# Patient Record
Sex: Female | Born: 1955 | Race: White | Hispanic: No | Marital: Married | Smoking: Never smoker
Health system: Southern US, Community
[De-identification: ages and names within clinical notes are randomized; demographics above are authoritative.]

## PROBLEM LIST (undated history)

## (undated) DIAGNOSIS — Z9889 Other specified postprocedural states: Secondary | ICD-10-CM

## (undated) DIAGNOSIS — I10 Essential (primary) hypertension: Secondary | ICD-10-CM

## (undated) DIAGNOSIS — R112 Nausea with vomiting, unspecified: Secondary | ICD-10-CM

## (undated) DIAGNOSIS — M1990 Unspecified osteoarthritis, unspecified site: Secondary | ICD-10-CM

## (undated) DIAGNOSIS — R011 Cardiac murmur, unspecified: Secondary | ICD-10-CM

## (undated) DIAGNOSIS — Z87442 Personal history of urinary calculi: Secondary | ICD-10-CM

## (undated) HISTORY — PX: KNEE ARTHROSCOPY W/ MENISCAL REPAIR: SHX1877

## (undated) HISTORY — PX: CHOLECYSTECTOMY: SHX55

## (undated) HISTORY — PX: CYST EXCISION: SHX5701

---

## 2016-02-21 ENCOUNTER — Emergency Department (HOSPITAL_COMMUNITY): Payer: BLUE CROSS/BLUE SHIELD

## 2016-02-21 ENCOUNTER — Encounter (HOSPITAL_COMMUNITY): Payer: Self-pay | Admitting: Family Medicine

## 2016-02-21 ENCOUNTER — Emergency Department (HOSPITAL_COMMUNITY)
Admission: EM | Admit: 2016-02-21 | Discharge: 2016-02-21 | Disposition: A | Discharge status: Home / Self Care | Payer: BLUE CROSS/BLUE SHIELD | Attending: Emergency Medicine | Admitting: Emergency Medicine

## 2016-02-21 DIAGNOSIS — R55 Syncope and collapse: Secondary | ICD-10-CM | POA: Diagnosis not present

## 2016-02-21 DIAGNOSIS — S43102A Unspecified dislocation of left acromioclavicular joint, initial encounter: Secondary | ICD-10-CM | POA: Insufficient documentation

## 2016-02-21 DIAGNOSIS — Y929 Unspecified place or not applicable: Secondary | ICD-10-CM | POA: Diagnosis not present

## 2016-02-21 DIAGNOSIS — Y939 Activity, unspecified: Secondary | ICD-10-CM | POA: Diagnosis not present

## 2016-02-21 DIAGNOSIS — I10 Essential (primary) hypertension: Secondary | ICD-10-CM | POA: Insufficient documentation

## 2016-02-21 DIAGNOSIS — W108XXA Fall (on) (from) other stairs and steps, initial encounter: Secondary | ICD-10-CM | POA: Diagnosis not present

## 2016-02-21 DIAGNOSIS — Y999 Unspecified external cause status: Secondary | ICD-10-CM | POA: Insufficient documentation

## 2016-02-21 DIAGNOSIS — S4992XA Unspecified injury of left shoulder and upper arm, initial encounter: Secondary | ICD-10-CM | POA: Diagnosis present

## 2016-02-21 DIAGNOSIS — Z79899 Other long term (current) drug therapy: Secondary | ICD-10-CM | POA: Diagnosis not present

## 2016-02-21 HISTORY — DX: Essential (primary) hypertension: I10

## 2016-02-21 MED ORDER — TETANUS-DIPHTH-ACELL PERTUSSIS 5-2.5-18.5 LF-MCG/0.5 IM SUSP: 0.5000 mL | Freq: Once | INTRAMUSCULAR | Status: DC

## 2016-02-21 MED ORDER — OXYCODONE-ACETAMINOPHEN 5-325 MG PO TABS
1.0000 | ORAL_TABLET | ORAL | Status: DC | PRN
  Administered 2016-02-21: 1 via ORAL

## 2016-02-21 MED ORDER — OXYCODONE-ACETAMINOPHEN 5-325 MG PO TABS: 2.0000 | ORAL_TABLET | Freq: Four times a day (QID) | ORAL | 0 refills | Status: DC | PRN

## 2016-02-21 MED ORDER — OXYCODONE-ACETAMINOPHEN 5-325 MG PO TABS
ORAL_TABLET | ORAL | Status: AC
  Filled 2016-02-21: qty 1

## 2016-02-21 MED ORDER — OXYCODONE-ACETAMINOPHEN 5-325 MG PO TABS
1.0000 | ORAL_TABLET | Freq: Once | ORAL | Status: AC
  Administered 2016-02-21: 1 via ORAL
  Filled 2016-02-21: qty 1

## 2016-02-21 NOTE — ED Provider Notes (Signed)
[facility]-EMERGENCY DEPT Provider Note   CSN: [id number] Arrival date & time: 02/21/16  1318     History   Chief Complaint Chief Complaint  Patient presents with  . Fall    HPI Roger ShelterRhonda Venditto is a 60 y.o. female.  The history is provided by the patient and medical records. No language interpreter was used.  Fall  Pertinent negatives include no chest pain, no abdominal pain, no headaches and no shortness of breath.   Roger ShelterRhonda Weltman is a 60 y.o. female  with a PMH of HTN who presents to the Emergency Department for evaluation after mechanical fall at 9am this morning. Patient had her dog on a leash about to go for a walk when the dog saw a bird and pulled her down approx. 7-8 stairs. She fell forward, hitting her left shoulder, left knee and right aspect of face. She is unsure about LOC. Family at bedside state that she seemed confused for about 20-30 minutes after incident. + nausea but no emesis. Back to baseline mental status currently. Denies headache or neck pain. Endorses left shoulder pain and left knee pain. Tetanus up-to-date. Not on blood thinners.    Past Medical History:  Diagnosis Date  . Hypertension     There are no active problems to display for this patient.   History reviewed. No pertinent surgical history.  OB History    No data available       Home Medications    Prior to Admission medications   Medication Sig Start Date End Date Taking? Authorizing Provider  CALCIUM-VITAMIN D PO Take 1 tablet by mouth daily.   Yes Historical Provider, MD  Flaxseed, Linseed, (FLAX SEED OIL) 1000 MG CAPS Take 1,000 mg by mouth 2 (two) times daily.   Yes Historical Provider, MD  lisinopril (PRINIVIL,ZESTRIL) 30 MG tablet Take 30 mg by mouth at bedtime. 01/12/16  Yes Historical Provider, MD  MAGNESIUM PO Take 1 tablet by mouth daily.   Yes Historical Provider, MD  meloxicam (MOBIC) 15 MG tablet Take 15 mg by mouth daily. 01/31/16  Yes Historical Provider, MD  Omega-3  Fatty Acids (FISH OIL) 1000 MG CAPS Take 1,000 mg by mouth 2 (two) times daily.   Yes Historical Provider, MD  oxyCODONE-acetaminophen (PERCOCET/ROXICET) 5-325 MG tablet Take 2 tablets by mouth every 6 (six) hours as needed for severe pain. 02/21/16   Chase PicketJaime Pilcher Caydn Justen, PA-C    Family History History reviewed. No pertinent family history.  Social History Social History  Substance Use Topics  . Smoking status: Never Smoker  . Smokeless tobacco: Never Used  . Alcohol use Not on file     Allergies   Penicillins and Codeine   Review of Systems Review of Systems  Constitutional: Negative for fatigue and fever.  HENT: Positive for facial swelling.   Eyes: Negative for visual disturbance.  Respiratory: Negative for cough and shortness of breath.   Cardiovascular: Negative for chest pain.  Gastrointestinal: Negative for abdominal pain, nausea and vomiting.  Musculoskeletal: Positive for arthralgias (L shoulder, L knee). Negative for back pain and neck pain.  Skin: Positive for wound.  Neurological: Positive for syncope (Possible). Negative for dizziness and headaches.  Hematological: Does not bruise/bleed easily.     Physical Exam Updated Vital Signs BP 178/77   Pulse 101   Temp 98.2 F (36.8 C) (Oral)   Resp 18   SpO2 98%   Physical Exam  Constitutional: She is oriented to person, place, and time. She appears well-developed and  well-nourished. No distress.  HENT:  Head: Normocephalic. Head is without Battle's sign.    Right Ear: No hemotympanum.  Left Ear: No hemotympanum.  Nose: No rhinorrhea or nasal septal hematoma.  Bruising under left eyelid.  Neck: Neck supple. No tracheal deviation present.  No midline or paraspinal tenderness. Full range of motion without pain.  Cardiovascular: Normal rate, regular rhythm and normal heart sounds.   No murmur heard. Pulmonary/Chest: Effort normal and breath sounds normal. No respiratory distress. She has no wheezes. She has  no rales.  Abdominal: Soft. She exhibits no distension. There is no tenderness.  Musculoskeletal:  [facility], decreased ROM. 2+ Radial pulse and sensation intact. 5/5 grip strength. Able to move elbow and wrist with no difficulty and no tenderness to palpation.  Left knee with tenderness over skin abrasion. Ligaments intact. No joint line or patellar tenderness. Full ROM.   Neurological: She is alert and oriented to person, place, and time.  All four extremities NVI.  Alert, oriented, thought content appropriate, able to give a coherent history. Speech is clear and goal oriented, able to follow commands.  Cranial Nerves:  II:  Peripheral visual fields grossly normal, pupils equal, round, reactive to light III, IV, VI: EOM intact bilaterally, ptosis not present V,VII: smile symmetric, eyes kept closed tightly against resistance, facial light touch sensation equal VIII: hearing grossly normal IX, X: symmetric soft palate movement, uvula elevates symmetrically  XI: bilateral shoulder shrug symmetric and strong XII: midline tongue extension Normal finger-to-nose and rapid alternating movements.  Skin: Skin is warm and dry.  Superficial abrasion to left knee.   Nursing note and vitals reviewed.    ED Treatments / Results  Labs (all labs ordered are listed, but only abnormal results are displayed) Labs Reviewed - No data to display  EKG  EKG Interpretation None       Radiology Ct Head Wo Contrast  Result Date: 02/21/2016 CLINICAL DATA:  Larey SeatFell down stairs today. Head in right facial injury with bruising and laceration. Initial encounter. EXAM: CT HEAD WITHOUT CONTRAST CT MAXILLOFACIAL WITHOUT CONTRAST TECHNIQUE: Multidetector CT imaging of the head and maxillofacial structures were performed using the standard protocol without intravenous contrast. Multiplanar CT image reconstructions of the maxillofacial structures were also generated. COMPARISON:  None. FINDINGS:  CT HEAD FINDINGS Brain: No evidence of acute infarction, hemorrhage, hydrocephalus, extra-axial collection or mass lesion/mass effect. Vascular: No hyperdense vessel or unexpected calcification. Skull: Normal. Negative for fracture or focal lesion. Other: None. CT MAXILLOFACIAL FINDINGS Osseous: No fracture or mandibular dislocation. No destructive process. Orbits: Negative. No traumatic or inflammatory finding. Sinuses: Clear. Soft tissues: Mild right maxillary soft tissue swelling. IMPRESSION: Negative noncontrast head CT. Mild right maxillary soft tissue swelling. No evidence of orbital or facial bone fracture. Electronically Signed   By: Myles RosenthalJohn  Stahl M.D.   On: 02/21/2016 17:57   Dg Shoulder Left  Result Date: 02/21/2016 CLINICAL DATA:  Fall downstairs, left shoulder pain EXAM: LEFT SHOULDER - 2+ VIEW COMPARISON:  None. FINDINGS: Left distal clavicle is superiorly displaced in relation to the distal clavicle without fracture compatible with a type 3 AC joint separation. Glenohumeral joint is grossly aligned. No other acute osseous finding. IMPRESSION: Grade 3 left AC joint separation. No acute displaced fracture. Electronically Signed   By: Judie PetitM.  Shick M.D.   On: 02/21/2016 15:18   Dg Knee Complete 4 Views Left  Result Date: 02/21/2016 CLINICAL DATA:  Left knee pain after falling down a flight of  stairs. EXAM: LEFT KNEE - COMPLETE 4+ VIEW COMPARISON:  None. FINDINGS: Marked medial joint space narrowing with associated spur formation. There is also lateral and patellofemoral spur formation. No fracture, dislocation or effusion. IMPRESSION: No fracture. Tricompartmental degenerative changes, most pronounced medially. Electronically Signed   By: Beckie Salts M.D.   On: 02/21/2016 15:15   Ct Maxillofacial Wo Contrast  Result Date: 02/21/2016 CLINICAL DATA:  Larey Seat down stairs today. Head in right facial injury with bruising and laceration. Initial encounter. EXAM: CT HEAD WITHOUT CONTRAST CT MAXILLOFACIAL  WITHOUT CONTRAST TECHNIQUE: Multidetector CT imaging of the head and maxillofacial structures were performed using the standard protocol without intravenous contrast. Multiplanar CT image reconstructions of the maxillofacial structures were also generated. COMPARISON:  None. FINDINGS: CT HEAD FINDINGS Brain: No evidence of acute infarction, hemorrhage, hydrocephalus, extra-axial collection or mass lesion/mass effect. Vascular: No hyperdense vessel or unexpected calcification. Skull: Normal. Negative for fracture or focal lesion. Other: None. CT MAXILLOFACIAL FINDINGS Osseous: No fracture or mandibular dislocation. No destructive process. Orbits: Negative. No traumatic or inflammatory finding. Sinuses: Clear. Soft tissues: Mild right maxillary soft tissue swelling. IMPRESSION: Negative noncontrast head CT. Mild right maxillary soft tissue swelling. No evidence of orbital or facial bone fracture. Electronically Signed   By: Myles Rosenthal M.D.   On: 02/21/2016 17:57    Procedures Procedures (including critical care time)  Medications Ordered in ED Medications  oxyCODONE-acetaminophen (PERCOCET/ROXICET) 5-325 MG per tablet 1 tablet (1 tablet Oral Given 02/21/16 1419)  oxyCODONE-acetaminophen (PERCOCET/ROXICET) 5-325 MG per tablet (not administered)  oxyCODONE-acetaminophen (PERCOCET/ROXICET) 5-325 MG per tablet 1 tablet (not administered)     Initial Impression / Assessment and Plan / ED Course  I have reviewed the triage vital signs and the nursing notes.  Pertinent labs & imaging results that were available during my care of the patient were reviewed by me and considered in my medical decision making (see chart for details).  Clinical Course    Teola Felipe is a 60 y.o. female who presents to ED for evaluation following mechanical fall. She did hit her head with ? LOC. No focal neuro deficits on exam. No midline cervical tenderness. C-spine cleared with Nexus criteria. Bruising and swelling  underneath right eye. CT head and maxillofacial reassuring. Patient complaining of left shoulder pain - x-ray shows grade III left AC joint separation. LUE is NVI on exam. Patient placed in sling and agrees to follow up with orthopedics. Rx for short course pain medication given. Symptomatic home care instructions discussed. All questions answered.   Patient seen by and discussed with Dr. Ethelda Chick who agrees with treatment plan.   Final Clinical Impressions(s) / ED Diagnoses   Final diagnoses:  AC separation, left, initial encounter    New Prescriptions New Prescriptions   OXYCODONE-ACETAMINOPHEN (PERCOCET/ROXICET) 5-325 MG TABLET    Take 2 tablets by mouth every 6 (six) hours as needed for severe pain.       [facility] Josilynn Losh, PA-C 02/21/16 1850    Doug Sou, MD 02/21/16 2253

## 2016-02-21 NOTE — ED Triage Notes (Signed)
Pt here for fall down some steps today. Pt was pulled down by a dog. Pt has injury to left shoulder and left knee. sts that she doesn't remember going into the house after the fall but feels okay now. Bruising to right eye. No knots on head. Denies dizziness or neck pain.

## 2016-02-21 NOTE — ED Provider Notes (Signed)
Patient fell down proximal me 7 steps 9 AM today when she was pulled down the steps by her [place] shepherd. She injured her left shoulder and left knee and face as result of event. No other Questionable loss of consciousness. She denies any neck pain. On exam alert Glasgow Coma Score 15 HEENT exam swollen and tender at left infraorbital area. No pain on extraocular movement. No diplopia on extraocular movement. Tiny abrasion about nose. Neck without tenderness. Full range of motion left upper extremity tender at the before meals joint. Radial pulse 2+. Left lower extremity there is an abrasion about the anterior knee without deformity or swelling or bony tenderness. All 4 extremities neurovascularly intact Cervical spine cleared by Nexus criteria   Doug SouSam Vianna Venezia, MD 02/21/16 1649

## 2016-02-21 NOTE — Progress Notes (Signed)
Orthopedic Tech Progress Note Patient Details:  Susan ShelterRhonda Carter 1955-03-26 [id number]  Ortho Devices Type of Ortho Device: Arm sling Ortho Device/Splint Location: Applied Arm Sling with Instructions for care to Left Arm. Family was at bedside. Ortho Device/Splint Interventions: Application, Adjustment   Alvina ChouWilliams, Anjalee Cope C 02/21/2016, 6:51 PM

## 2016-02-21 NOTE — ED Notes (Signed)
Ortho in room

## 2016-02-21 NOTE — Discharge Instructions (Signed)
Wear sling throughout the day. Apply ice to aid in pain and swelling of knee, shoulder and face. Pain medication as needed for severe pain. Please call your orthopedist on Monday morning to schedule a follow up appointment.  Return to ER for new or worsening symptoms, any additional concerns.

## 2019-03-21 ENCOUNTER — Emergency Department (HOSPITAL_COMMUNITY)
Admission: EM | Admit: 2019-03-21 | Discharge: 2019-03-22 | Disposition: A | Discharge status: Home / Self Care | Payer: BC Managed Care – PPO | Source: Home / Self Care | Attending: Emergency Medicine | Admitting: Emergency Medicine

## 2019-03-21 ENCOUNTER — Emergency Department (HOSPITAL_COMMUNITY): Payer: BC Managed Care – PPO

## 2019-03-21 ENCOUNTER — Encounter (HOSPITAL_COMMUNITY): Payer: Self-pay | Admitting: Emergency Medicine

## 2019-03-21 DIAGNOSIS — N12 Tubulo-interstitial nephritis, not specified as acute or chronic: Secondary | ICD-10-CM

## 2019-03-21 DIAGNOSIS — Z886 Allergy status to analgesic agent status: Secondary | ICD-10-CM | POA: Diagnosis not present

## 2019-03-21 DIAGNOSIS — N136 Pyonephrosis: Secondary | ICD-10-CM | POA: Diagnosis not present

## 2019-03-21 DIAGNOSIS — Z885 Allergy status to narcotic agent status: Secondary | ICD-10-CM | POA: Diagnosis not present

## 2019-03-21 DIAGNOSIS — N390 Urinary tract infection, site not specified: Secondary | ICD-10-CM | POA: Diagnosis present

## 2019-03-21 DIAGNOSIS — N200 Calculus of kidney: Secondary | ICD-10-CM

## 2019-03-21 DIAGNOSIS — Z6841 Body Mass Index (BMI) 40.0 and over, adult: Secondary | ICD-10-CM | POA: Diagnosis not present

## 2019-03-21 DIAGNOSIS — Z880 Allergy status to penicillin: Secondary | ICD-10-CM | POA: Diagnosis not present

## 2019-03-21 DIAGNOSIS — Z20828 Contact with and (suspected) exposure to other viral communicable diseases: Secondary | ICD-10-CM | POA: Diagnosis not present

## 2019-03-21 DIAGNOSIS — Z791 Long term (current) use of non-steroidal anti-inflammatories (NSAID): Secondary | ICD-10-CM | POA: Diagnosis not present

## 2019-03-21 DIAGNOSIS — Z79899 Other long term (current) drug therapy: Secondary | ICD-10-CM | POA: Diagnosis not present

## 2019-03-21 DIAGNOSIS — I10 Essential (primary) hypertension: Secondary | ICD-10-CM | POA: Diagnosis not present

## 2019-03-21 LAB — COMPREHENSIVE METABOLIC PANEL
ALT: 29 U/L (ref 0–44)
AST: 27 U/L (ref 15–41)
Albumin: 3.6 g/dL (ref 3.5–5.0)
Alkaline Phosphatase: 59 U/L (ref 38–126)
Anion gap: 10 (ref 5–15)
BUN: 16 mg/dL (ref 8–23)
CO2: 26 mmol/L (ref 22–32)
Calcium: 9.3 mg/dL (ref 8.9–10.3)
Chloride: 105 mmol/L (ref 98–111)
Creatinine, Ser: 0.66 mg/dL (ref 0.44–1.00)
GFR calc Af Amer: >60 mL/min (ref >60)
GFR calc non Af Amer: >60 mL/min (ref >60)
Glucose, Bld: 125 mg/dL — ABNORMAL HIGH (ref 70–99)
Potassium: 4.1 mmol/L (ref 3.5–5.1)
Sodium: 141 mmol/L (ref 135–145)
Total Bilirubin: 1.3 mg/dL — ABNORMAL HIGH (ref 0.3–1.2)
Total Protein: 7.5 g/dL (ref 6.5–8.1)

## 2019-03-21 LAB — URINALYSIS, ROUTINE W REFLEX MICROSCOPIC
Bilirubin Urine: NEGATIVE
Glucose, UA: NEGATIVE mg/dL
Ketones, ur: 80 mg/dL — AB
Nitrite: NEGATIVE
Protein, ur: 30 mg/dL — AB
RBC / HPF: >50 RBC/hpf — ABNORMAL HIGH (ref 0–5)
Specific Gravity, Urine: 1.03 (ref 1.005–1.030)
pH: 5 (ref 5.0–8.0)

## 2019-03-21 LAB — LACTIC ACID, PLASMA: Lactic Acid, Venous: 1.5 mmol/L (ref 0.5–1.9)

## 2019-03-21 MED ORDER — CEPHALEXIN 500 MG PO CAPS
1000.0000 mg | ORAL_CAPSULE | Freq: Once | ORAL | Status: AC
  Administered 2019-03-21: 1000 mg via ORAL
  Filled 2019-03-21: qty 2

## 2019-03-21 NOTE — ED Triage Notes (Signed)
Patient reports dx with UTI yesterday. States taking abx without relief. C/o right flank pain radiating to lower abdomen. Reports two episodes of vomiting today.

## 2019-03-22 ENCOUNTER — Encounter (HOSPITAL_COMMUNITY)
Admission: AD | Disposition: A | Discharge status: Home / Self Care | Payer: Self-pay | Source: Ambulatory Visit | Attending: Urology

## 2019-03-22 ENCOUNTER — Other Ambulatory Visit: Payer: Self-pay | Admitting: Urology

## 2019-03-22 ENCOUNTER — Telehealth: Payer: Self-pay | Admitting: Urology

## 2019-03-22 ENCOUNTER — Encounter (HOSPITAL_COMMUNITY): Payer: Self-pay | Admitting: Urology

## 2019-03-22 ENCOUNTER — Inpatient Hospital Stay (HOSPITAL_COMMUNITY): Payer: BC Managed Care – PPO | Admitting: Certified Registered Nurse Anesthetist

## 2019-03-22 ENCOUNTER — Other Ambulatory Visit: Payer: Self-pay

## 2019-03-22 ENCOUNTER — Inpatient Hospital Stay (HOSPITAL_COMMUNITY): Payer: BC Managed Care – PPO

## 2019-03-22 ENCOUNTER — Ambulatory Visit (HOSPITAL_COMMUNITY)
Admission: AD | Admit: 2019-03-22 | Discharge: 2019-03-22 | Disposition: A | Discharge status: Home / Self Care | Payer: BC Managed Care – PPO | Source: Ambulatory Visit | Attending: Urology | Admitting: Urology

## 2019-03-22 DIAGNOSIS — I10 Essential (primary) hypertension: Secondary | ICD-10-CM | POA: Insufficient documentation

## 2019-03-22 DIAGNOSIS — Z20828 Contact with and (suspected) exposure to other viral communicable diseases: Secondary | ICD-10-CM | POA: Insufficient documentation

## 2019-03-22 DIAGNOSIS — N136 Pyonephrosis: Secondary | ICD-10-CM | POA: Insufficient documentation

## 2019-03-22 DIAGNOSIS — Z791 Long term (current) use of non-steroidal anti-inflammatories (NSAID): Secondary | ICD-10-CM | POA: Insufficient documentation

## 2019-03-22 DIAGNOSIS — Z79899 Other long term (current) drug therapy: Secondary | ICD-10-CM | POA: Insufficient documentation

## 2019-03-22 DIAGNOSIS — Z886 Allergy status to analgesic agent status: Secondary | ICD-10-CM | POA: Insufficient documentation

## 2019-03-22 DIAGNOSIS — Z880 Allergy status to penicillin: Secondary | ICD-10-CM | POA: Insufficient documentation

## 2019-03-22 DIAGNOSIS — Z6841 Body Mass Index (BMI) 40.0 and over, adult: Secondary | ICD-10-CM | POA: Insufficient documentation

## 2019-03-22 DIAGNOSIS — Z885 Allergy status to narcotic agent status: Secondary | ICD-10-CM | POA: Insufficient documentation

## 2019-03-22 HISTORY — DX: Personal history of urinary calculi: Z87.442

## 2019-03-22 HISTORY — DX: Other specified postprocedural states: Z98.890

## 2019-03-22 HISTORY — DX: Nausea with vomiting, unspecified: R11.2

## 2019-03-22 HISTORY — DX: Unspecified osteoarthritis, unspecified site: M19.90

## 2019-03-22 HISTORY — PX: CYSTOSCOPY W/ URETERAL STENT PLACEMENT: SHX1429

## 2019-03-22 HISTORY — DX: Cardiac murmur, unspecified: R01.1

## 2019-03-22 LAB — URINALYSIS, ROUTINE W REFLEX MICROSCOPIC
Bilirubin Urine: NEGATIVE
Glucose, UA: NEGATIVE mg/dL
Ketones, ur: 20 mg/dL — AB
Nitrite: NEGATIVE
Protein, ur: 100 mg/dL — AB
RBC / HPF: >50 RBC/hpf — ABNORMAL HIGH (ref 0–5)
Specific Gravity, Urine: 1.028 (ref 1.005–1.030)
WBC, UA: >50 WBC/hpf — ABNORMAL HIGH (ref 0–5)
pH: 5 (ref 5.0–8.0)

## 2019-03-22 LAB — CBC WITH DIFFERENTIAL/PLATELET
Abs Immature Granulocytes: 0.04 10*3/uL (ref 0.00–0.07)
Basophils Absolute: 0 10*3/uL (ref 0.0–0.1)
Basophils Relative: 0 %
Eosinophils Absolute: 0 10*3/uL (ref 0.0–0.5)
Eosinophils Relative: 0 %
HCT: 43.8 % (ref 36.0–46.0)
Hemoglobin: 13.9 g/dL (ref 12.0–15.0)
Immature Granulocytes: 0 %
Lymphocytes Relative: 13 %
Lymphs Abs: 1.4 10*3/uL (ref 0.7–4.0)
MCH: 28.9 pg (ref 26.0–34.0)
MCHC: 31.7 g/dL (ref 30.0–36.0)
MCV: 91.1 fL (ref 80.0–100.0)
Monocytes Absolute: 1.6 10*3/uL — ABNORMAL HIGH (ref 0.1–1.0)
Monocytes Relative: 15 %
Neutro Abs: 7.7 10*3/uL (ref 1.7–7.7)
Neutrophils Relative %: 72 %
Platelets: 223 10*3/uL (ref 150–400)
RBC: 4.81 MIL/uL (ref 3.87–5.11)
RDW: 13.1 % (ref 11.5–15.5)
WBC: 10.8 10*3/uL — ABNORMAL HIGH (ref 4.0–10.5)
nRBC: 0 % (ref 0.0–0.2)

## 2019-03-22 LAB — RESPIRATORY PANEL BY RT PCR (FLU A&B, COVID)
Influenza A by PCR: NEGATIVE
Influenza B by PCR: NEGATIVE
SARS Coronavirus 2 by RT PCR: NEGATIVE

## 2019-03-22 SURGERY — CYSTOSCOPY, WITH RETROGRADE PYELOGRAM AND URETERAL STENT INSERTION
Anesthesia: General | Laterality: Right

## 2019-03-22 MED ORDER — SCOPOLAMINE 1 MG/3DAYS TD PT72: 1.0000 | MEDICATED_PATCH | TRANSDERMAL | Status: DC

## 2019-03-22 MED ORDER — ONDANSETRON HCL 4 MG PO TABS: 4.0000 mg | ORAL_TABLET | Freq: Three times a day (TID) | ORAL | 0 refills | Status: AC | PRN

## 2019-03-22 MED ORDER — PHENYLEPHRINE 40 MCG/ML (10ML) SYRINGE FOR IV PUSH (FOR BLOOD PRESSURE SUPPORT)
PREFILLED_SYRINGE | INTRAVENOUS | Status: AC
  Filled 2019-03-22: qty 10

## 2019-03-22 MED ORDER — HYDROCODONE-ACETAMINOPHEN 5-325 MG PO TABS: 1.0000 | ORAL_TABLET | ORAL | 0 refills | Status: AC | PRN

## 2019-03-22 MED ORDER — DEXAMETHASONE SODIUM PHOSPHATE 10 MG/ML IJ SOLN
INTRAMUSCULAR | Status: DC | PRN
  Administered 2019-03-22: 10 mg via INTRAVENOUS

## 2019-03-22 MED ORDER — KETOROLAC TROMETHAMINE 30 MG/ML IJ SOLN
INTRAMUSCULAR | Status: AC
  Filled 2019-03-22: qty 1

## 2019-03-22 MED ORDER — DEXAMETHASONE SODIUM PHOSPHATE 10 MG/ML IJ SOLN
INTRAMUSCULAR | Status: AC
  Filled 2019-03-22: qty 1

## 2019-03-22 MED ORDER — IOHEXOL 300 MG/ML  SOLN
INTRAMUSCULAR | Status: DC | PRN
  Administered 2019-03-22: 7 mL

## 2019-03-22 MED ORDER — LACTATED RINGERS IV SOLN: INTRAVENOUS | Status: DC

## 2019-03-22 MED ORDER — FENTANYL CITRATE (PF) 100 MCG/2ML IJ SOLN: 25.0000 ug | INTRAMUSCULAR | Status: DC | PRN

## 2019-03-22 MED ORDER — FENTANYL CITRATE (PF) 100 MCG/2ML IJ SOLN
INTRAMUSCULAR | Status: AC
  Filled 2019-03-22: qty 2

## 2019-03-22 MED ORDER — TAMSULOSIN HCL 0.4 MG PO CAPS: 0.4000 mg | ORAL_CAPSULE | Freq: Every day | ORAL | 0 refills | Status: AC

## 2019-03-22 MED ORDER — CIPROFLOXACIN IN D5W 400 MG/200ML IV SOLN
INTRAVENOUS | Status: AC
  Filled 2019-03-22: qty 200

## 2019-03-22 MED ORDER — FENTANYL CITRATE (PF) 100 MCG/2ML IJ SOLN
INTRAMUSCULAR | Status: DC | PRN
  Administered 2019-03-22 (×2): 50 ug via INTRAVENOUS

## 2019-03-22 MED ORDER — MIDAZOLAM HCL 5 MG/5ML IJ SOLN
INTRAMUSCULAR | Status: DC | PRN
  Administered 2019-03-22: 2 mg via INTRAVENOUS

## 2019-03-22 MED ORDER — CIPROFLOXACIN IN D5W 400 MG/200ML IV SOLN
400.0000 mg | INTRAVENOUS | Status: AC
  Administered 2019-03-22: 400 mg via INTRAVENOUS

## 2019-03-22 MED ORDER — PROMETHAZINE HCL 25 MG/ML IJ SOLN: 6.2500 mg | INTRAMUSCULAR | Status: DC | PRN

## 2019-03-22 MED ORDER — SCOPOLAMINE 1 MG/3DAYS TD PT72
MEDICATED_PATCH | TRANSDERMAL | Status: AC
  Administered 2019-03-22: 17:00:00 1.5 mg via TRANSDERMAL
  Filled 2019-03-22: qty 1

## 2019-03-22 MED ORDER — PHENYLEPHRINE 40 MCG/ML (10ML) SYRINGE FOR IV PUSH (FOR BLOOD PRESSURE SUPPORT)
PREFILLED_SYRINGE | INTRAVENOUS | Status: DC | PRN
  Administered 2019-03-22 (×4): 120 ug via INTRAVENOUS

## 2019-03-22 MED ORDER — CEPHALEXIN 500 MG PO CAPS: 500.0000 mg | ORAL_CAPSULE | Freq: Four times a day (QID) | ORAL | 0 refills | Status: DC

## 2019-03-22 MED ORDER — ONDANSETRON HCL 4 MG/2ML IJ SOLN
INTRAMUSCULAR | Status: DC | PRN
  Administered 2019-03-22: 4 mg via INTRAVENOUS

## 2019-03-22 MED ORDER — IBUPROFEN 200 MG PO TABS
400.0000 mg | ORAL_TABLET | Freq: Once | ORAL | Status: AC
  Administered 2019-03-22: 400 mg via ORAL
  Filled 2019-03-22: qty 2

## 2019-03-22 MED ORDER — ONDANSETRON HCL 4 MG/2ML IJ SOLN
INTRAMUSCULAR | Status: AC
  Filled 2019-03-22: qty 2

## 2019-03-22 MED ORDER — OXYBUTYNIN CHLORIDE 5 MG PO TABS: 5.0000 mg | ORAL_TABLET | Freq: Three times a day (TID) | ORAL | 0 refills | Status: AC | PRN

## 2019-03-22 MED ORDER — ACETAMINOPHEN 500 MG PO TABS
1000.0000 mg | ORAL_TABLET | Freq: Once | ORAL | Status: AC
  Administered 2019-03-22: 1000 mg via ORAL
  Filled 2019-03-22: qty 2

## 2019-03-22 MED ORDER — LIDOCAINE 2% (20 MG/ML) 5 ML SYRINGE
INTRAMUSCULAR | Status: DC | PRN
  Administered 2019-03-22: 60 mg via INTRAVENOUS

## 2019-03-22 MED ORDER — MIDAZOLAM HCL 2 MG/2ML IJ SOLN
INTRAMUSCULAR | Status: AC
  Filled 2019-03-22: qty 2

## 2019-03-22 MED ORDER — PROPOFOL 10 MG/ML IV BOLUS
INTRAVENOUS | Status: DC | PRN
  Administered 2019-03-22: 200 mg via INTRAVENOUS

## 2019-03-22 MED ORDER — PHENAZOPYRIDINE HCL 100 MG PO TABS: 100.0000 mg | ORAL_TABLET | Freq: Three times a day (TID) | ORAL | 0 refills | Status: AC | PRN

## 2019-03-22 MED ORDER — KETOROLAC TROMETHAMINE 15 MG/ML IJ SOLN
INTRAMUSCULAR | Status: DC | PRN
  Administered 2019-03-22: 15 mg via INTRAVENOUS

## 2019-03-22 MED ORDER — SODIUM CHLORIDE 0.9 % IR SOLN
Status: DC | PRN
  Administered 2019-03-22: 3000 mL

## 2019-03-22 MED ORDER — 0.9 % SODIUM CHLORIDE (POUR BTL) OPTIME
TOPICAL | Status: DC | PRN
  Administered 2019-03-22: 1000 mL

## 2019-03-22 SURGICAL SUPPLY — 13 items
BAG URO CATCHER STRL LF (MISCELLANEOUS) ×3 IMPLANT
CATH INTERMIT  6FR 70CM (CATHETERS) ×3 IMPLANT
CLOTH BEACON ORANGE TIMEOUT ST (SAFETY) ×3 IMPLANT
GLOVE BIO SURGEON STRL SZ 6 (GLOVE) ×3 IMPLANT
GOWN SPEC L3 MED W/TWL (GOWN DISPOSABLE) ×3 IMPLANT
GOWN STRL REUS W/TWL LRG LVL3 (GOWN DISPOSABLE) ×6 IMPLANT
GUIDEWIRE STR DUAL SENSOR (WIRE) ×3 IMPLANT
KIT TURNOVER KIT A (KITS) IMPLANT
MANIFOLD NEPTUNE II (INSTRUMENTS) ×3 IMPLANT
PACK CYSTO (CUSTOM PROCEDURE TRAY) ×3 IMPLANT
TUBING CONNECTING 10 (TUBING) ×2 IMPLANT
TUBING CONNECTING 10' (TUBING) ×1
TUBING UROLOGY SET (TUBING) IMPLANT

## 2019-03-22 NOTE — ED Provider Notes (Signed)
Emergency Department Provider Note   I have reviewed the triage vital signs and the nursing notes.   HISTORY  Chief Complaint Urinary Tract Infection   HPI Susan Carter is a 64 y.o. female who presents for urinary tract infection.  Patient states that she was diagnosed with a urinary tract infection yesterday urgent care was started on nitrofurantoin.  She had a little bit of mild backache and a fever of 103.  She states that she had a couple doses of her medication does not seem to be getting better as it usually does.  She actually states her back pain seems to be getting worse.  Her fever has improved.  She had some nausea but no vomiting.  No other sick contacts.  She is tested negative for Covid yesterday.   No other associated or modifying symptoms.    Past Medical History:  Diagnosis Date  . Hypertension     There are no problems to display for this patient.   History reviewed. No pertinent surgical history.  Current Outpatient Rx  . Order #: [id number] Class: Historical Med  . Order #: [id number] Class: Historical Med  . Order #: [id number] Class: Historical Med  . Order #: [id number] Class: Historical Med  . Order #: [id number] Class: Historical Med  . Order #: [id number] Class: Historical Med  . Order #: [id number] Class: Historical Med  . Order #: [id number] Class: Normal  . Order #: [id number] Class: Normal    Allergies Penicillins, Codeine, and Nabumetone  No family history on file.  Social History Social History   Tobacco Use  . Smoking status: Never Smoker  . Smokeless tobacco: Never Used  Substance Use Topics  . Alcohol use: Not on file  . Drug use: Not on file    Review of Systems  All other systems negative except as documented in the HPI. All pertinent positives and negatives as reviewed in the HPI. ____________________________________________   PHYSICAL EXAM:  VITAL SIGNS: ED Triage Vitals [03/21/19 1719]  Enc Vitals Group     BP (!) 193/96       Pulse Rate (!) 115     Resp 18     Temp 99.3 F (37.4 C)     Temp Source Oral     SpO2 100 %     Weight 225 lb (102.1 kg)     Height 5\' 1"  (1.549 m)    Constitutional: Alert and oriented. Well appearing and in no acute distress. Eyes: Conjunctivae are normal. PERRL. EOMI. Head: Atraumatic. Nose: No congestion/rhinnorhea. Mouth/Throat: Mucous membranes are moist.  Oropharynx non-erythematous. Neck: No stridor.  No meningeal signs.   Cardiovascular: Normal rate, regular rhythm. Good peripheral circulation. Grossly normal heart sounds.   Respiratory: Normal respiratory effort.  No retractions. Lungs CTAB. Gastrointestinal: Soft and nontender. No distention.  Musculoskeletal: No lower extremity tenderness nor edema. No gross deformities of extremities.  Mild right CVA tenderness to palpation. Neurologic:  Normal speech and language. No gross focal neurologic deficits are appreciated.  Skin:  Skin is warm, dry and intact. No rash noted.  ____________________________________________   LABS (all labs ordered are listed, but only abnormal results are displayed)  Labs Reviewed  COMPREHENSIVE METABOLIC PANEL - Abnormal; Notable for the following components:      Result Value   Glucose, Bld 125 (*)    Total Bilirubin 1.3 (*)    All other components within normal limits  URINALYSIS, ROUTINE W REFLEX MICROSCOPIC - Abnormal; Notable for the following components:   Color, Urine AMBER (*)  APPearance HAZY (*)    Hgb urine dipstick MODERATE (*)    Ketones, ur 80 (*)    Protein, ur 30 (*)    Leukocytes,Ua MODERATE (*)    RBC / HPF >50 (*)    Bacteria, UA MANY (*)    All other components within normal limits  CBC WITH DIFFERENTIAL/PLATELET - Abnormal; Notable for the following components:   WBC 10.8 (*)    Monocytes Absolute 1.6 (*)    All other components within normal limits  URINALYSIS, ROUTINE W REFLEX MICROSCOPIC - Abnormal; Notable for the following components:   Color,  Urine AMBER (*)    APPearance HAZY (*)    Hgb urine dipstick LARGE (*)    Ketones, ur 20 (*)    Protein, ur 100 (*)    Leukocytes,Ua LARGE (*)    RBC / HPF >50 (*)    WBC, UA >50 (*)    Bacteria, UA MANY (*)    All other components within normal limits  URINE CULTURE  LACTIC ACID, PLASMA  LACTIC ACID, PLASMA  CBC WITH DIFFERENTIAL/PLATELET  CBC WITH DIFFERENTIAL/PLATELET   ____________________________________________   RADIOLOGY  CT Renal Stone Study  Result Date: 03/22/2019 CLINICAL DATA:  Right flank pain. Diagnosed with urinary tract infection yesterday. No relief with antibiotics. EXAM: CT ABDOMEN AND PELVIS WITHOUT CONTRAST TECHNIQUE: Multidetector CT imaging of the abdomen and pelvis was performed following the standard protocol without IV contrast. COMPARISON:  None. FINDINGS: Lower chest: Lung bases are clear. Hepatobiliary: No focal liver abnormality is seen. Status post cholecystectomy. No biliary dilatation. Pancreas: No ductal dilatation or inflammation. Spleen: Normal in size without focal abnormality. Small splenule anteriorly. Adrenals/Urinary Tract: Normal adrenal glands. Obstructing 4 x 4 mm stone in the right proximal ureter just distal to the ureteropelvic junction with mild right hydronephrosis. The more distal ureter is decompressed. There are phleboliths in the adjacent right ovarian vein. Additional nonobstructing 3 mm stone in the lower right kidney. No left hydronephrosis or left renal calculi. Cyst in the lower left kidney measures approximately 2.8 cm, not completely characterized in the absence of IV contrast. Left ureter is decompressed. Urinary bladder is partially distended. No bladder stone or wall thickening. Stomach/Bowel: Stomach is unremarkable. No bowel wall thickening, obstruction, or inflammation. Appendix is normal, courses anterior to the cecum, series 2, image 55. Mild left colonic diverticulosis without diverticulitis. Vascular/Lymphatic: Abdominal  aorta is normal in caliber. Retroaortic left renal vein. No abdominopelvic adenopathy. Reproductive: Uterus and bilateral adnexa are unremarkable. Other: Tiny fat containing umbilical hernia. No free air, free fluid, or intra-abdominal fluid collection. Musculoskeletal: Multilevel degenerative change in the lumbar spine. There are no acute or suspicious osseous abnormalities. IMPRESSION: 1. Obstructing 4 x 4 mm stone in the right proximal ureter with mild hydronephrosis. 2. Additional nonobstructing 3 mm stone in the lower right kidney. 3. Left colonic diverticulosis without diverticulitis. Electronically Signed   By: Keith Rake M.D.   On: 03/22/2019 01:00    ____________________________________________   INITIAL IMPRESSION / ASSESSMENT AND PLAN / ED COURSE  Patient with likely pyelonephritis but she has a history of kidney stone so we will get a stone study to make sure she does not have infected stone as this will change her outpatient management.  She is stable and does not appear to need admission, blood cultures or septic work-up at this time.  Found ot have a kidney stone along with UTI. Possibly early pyelo? Or back pain is from kidney stone. Either way, not  septic appearing. abx started. Tolerating PO. Stable for discharge on oral medications with urology follow up for same.   Pertinent labs & imaging results that were available during my care of the patient were reviewed by me and considered in my medical decision making (see chart for details).  A medical screening exam was performed and I feel the patient has had an appropriate workup for their chief complaint at this time and likelihood of emergent condition existing is low. They have been counseled on decision, discharge, follow up and which symptoms necessitate immediate return to the emergency department. They or their family verbally stated understanding and agreement with plan and discharged in stable condition.     ____________________________________________  FINAL CLINICAL IMPRESSION(S) / ED DIAGNOSES  Final diagnoses:  Pyelonephritis  Kidney stone     MEDICATIONS GIVEN DURING THIS VISIT:  Medications  cephALEXin (KEFLEX) capsule 1,000 mg (1,000 mg Oral Given 03/21/19 2338)  ibuprofen (ADVIL) tablet 400 mg (400 mg Oral Given 03/22/19 0211)     NEW OUTPATIENT MEDICATIONS STARTED DURING THIS VISIT:  Discharge Medication List as of 03/22/2019  3:02 AM    START taking these medications   Details  cephALEXin (KEFLEX) 500 MG capsule Take 1 capsule (500 mg total) by mouth 4 (four) times daily., Starting Thu 03/22/2019, Normal        Note:  This note was prepared with assistance of Dragon voice recognition software. Occasional wrong-word or sound-a-like substitutions may have occurred due to the inherent limitations of voice recognition software.   Madeline Pho, Barbara CowerJason, MD 03/22/19 [phone]

## 2019-03-22 NOTE — Interval H&P Note (Signed)
History and Physical Interval Note:  03/22/2019 4:40 PM  Susan Carter  has presented today for surgery, with the diagnosis of KIDNEY STONE RIGHT.  The various methods of treatment have been discussed with the patient and family. After consideration of risks, benefits and other options for treatment, the patient has consented to  Procedure(s): CYSTOSCOPY WITH RETROGRADE PYELOGRAM/URETERAL STENT PLACEMENT (Right) as a surgical intervention.  The patient's history has been reviewed, patient examined, no change in status, stable for surgery.  I have reviewed the patient's chart and labs.  Questions were answered to the patient's satisfaction.     Viha Kriegel D Ayeshia Coppin

## 2019-03-22 NOTE — Anesthesia Procedure Notes (Signed)
Procedure Name: LMA Insertion Date/Time: 03/22/2019 6:05 PM Performed by: Silas [place], CRNA Pre-anesthesia Checklist: Patient identified, Emergency Drugs available, Suction available and Patient being monitored Patient Re-evaluated:Patient Re-evaluated prior to induction Oxygen Delivery Method: Circle system utilized Preoxygenation: Pre-oxygenation with 100% oxygen Induction Type: IV induction LMA: LMA inserted LMA Size: 4.0 Tube type: Oral Number of attempts: 1 Placement Confirmation: positive ETCO2 and breath sounds checked- equal and bilateral Tube secured with: Tape Dental Injury: Teeth and Oropharynx as per pre-operative assessment

## 2019-03-22 NOTE — ED Notes (Signed)
Pt declined discharge vitals. Ambulated out of the ED in no distress. Daughter is driving her home.

## 2019-03-22 NOTE — Op Note (Signed)
PATIENT:  Susan Carter  Preoperative diagnosis:  1. Right ureteral calculus  Postoperative diagnosis:  1. Right ureteral calculus  Procedure:  1. Cystoscopy 2. right retrograde pyelography with interpretation  3. right ureteral stent placement  (6Fr x 24cm)   Surgeon: Jacalyn Lefevre, M.D.  Anesthesia: General  FINDINGS:  1. Normal urethra 2. Normal bladder mucosa 3. Moderate hydronephrosis to distal ureter 4. 6Fr x 24cm right ureteral stent  Complications: None  EBL: Minimal  Specimens: None  Indication: 63 yo woman with right flank pain with 4 mm ureteral calculus and signs of infection (UA and fever).   Description of procedure:  The patient was taken to the operating room and general anesthesia was induced.  The patient was placed in the dorsal lithotomy position, prepped and draped in the usual sterile fashion, and preoperative antibiotics were administered. A preoperative time-out was performed.   Cystourethroscopy was performed.  The patient's urethra was examined and was normal. The bladder was then systematically examined in its entirety. There was no evidence for any bladder tumors, stones, or other mucosal pathology.    Attention then turned to the right ureteral orifice and a ureteral catheter was used to intubate the ureteral orifice.  Full-strength Omnipaque contrast was injected through the ureteral catheter and a retrograde pyelogram was performed.  A 0.038 sensor guidewire was then advanced up the right ureter into the renal pelvis under fluoroscopic guidance.  The wire was then backloaded through the cystoscope and a ureteral stent was advance over the wire using Seldinger technique.  The stent was positioned appropriately under fluoroscopic and cystoscopic guidance.  The wire was then removed with an adequate stent curl noted in the renal pelvis as well as in the bladder.  The bladder was then emptied and the procedure ended.  The patient appeared to  tolerate the procedure well and without complications.  The patient was able to be awakened and transferred to the recovery unit in satisfactory condition.   FOLLOW UP: Patient will be scheduled for definitive kidney stone surgery in 2-3 weeks.

## 2019-03-22 NOTE — Transfer of Care (Signed)
Immediate Anesthesia Transfer of Care Note  Patient: Susan Carter  Procedure(s) Performed: CYSTOSCOPY WITH RETROGRADE PYELOGRAM/URETERAL STENT PLACEMENT (Right )  Patient Location: PACU  Anesthesia Type:General  Level of Consciousness: drowsy and patient cooperative  Airway & Oxygen Therapy: Patient Spontanous Breathing and Patient connected to face mask oxygen  Post-op Assessment: Report given to RN and Post -op Vital signs reviewed and stable  Post vital signs: Reviewed and stable  Last Vitals:  Vitals Value Taken Time  BP 146/80 03/22/19 1841  Temp 36.9 C 03/22/19 1841  Pulse 103 03/22/19 1842  Resp 24 03/22/19 1842  SpO2 100 % 03/22/19 1842  Vitals shown include unvalidated device data.  Last Pain:  Vitals:   03/22/19 1606  TempSrc:   PainSc: 0-No pain      Patients Stated Pain Goal: 3 (67/34/19 3790)  Complications: No apparent anesthesia complications

## 2019-03-22 NOTE — Discharge Instructions (Addendum)
Please return the emergency department immediately if you have high fevers, nausea, vomiting or worsening back pain.  Otherwise follow-up with urology within the next week to ensure resolution of symptoms and kidney stone.

## 2019-03-22 NOTE — Anesthesia Preprocedure Evaluation (Addendum)
Anesthesia Evaluation  Patient identified by MRN, date of birth, ID band Patient awake    Reviewed: Allergy & Precautions, NPO status , Patient's Chart, lab work & pertinent test results  History of Anesthesia Complications (+) PONV and history of anesthetic complications  Airway Mallampati: II  TM Distance: >3 FB Neck ROM: Full    Dental no notable dental hx. (+) Dental Advisory Given   Pulmonary neg pulmonary ROS,    Pulmonary exam normal        Cardiovascular hypertension, Normal cardiovascular exam     Neuro/Psych negative neurological ROS     GI/Hepatic negative GI ROS, Neg liver ROS,   Endo/Other  Morbid obesity  Renal/GU negative Renal ROS     Musculoskeletal negative musculoskeletal ROS (+)   Abdominal   Peds  Hematology negative hematology ROS (+)   Anesthesia Other Findings Day of surgery medications reviewed with the patient.  Reproductive/Obstetrics                            Anesthesia Physical Anesthesia Plan  ASA: III  Anesthesia Plan: General   Post-op Pain Management:    Induction: Intravenous  PONV Risk Score and Plan: 3 and Ondansetron, Dexamethasone and Scopolamine patch - Pre-op  Airway Management Planned: LMA  Additional Equipment:   Intra-op Plan:   Post-operative Plan: Extubation in OR  Informed Consent: I have reviewed the patients History and Physical, chart, labs and discussed the procedure including the risks, benefits and alternatives for the proposed anesthesia with the patient or authorized representative who has indicated his/her understanding and acceptance.     Dental advisory given  Plan Discussed with: CRNA and Anesthesiologist  Anesthesia Plan Comments:        Anesthesia Quick Evaluation

## 2019-03-22 NOTE — H&P (View-Only) (Signed)
CC/HPI: cc: right ureteral calculus   03/22/19: 63 year old woman who presented to the ER last night with right flank pain found to have a 4 mm obstructing proximal ureteral calculus and urinalysis concerning for infection with many bacteria and many WBCs. Patient was febrile to 103 at home but then afebrile in the ER. Patient states that she is feeling a lot better than yesterday and has not had a fever today. Her pain is also improved. She does have a history of stones and had ESWL in 2011-12.     ALLERGIES: penicillin     MEDICATIONS: Keflex 500 mg capsule  Lisinopril 30 mg tablet  Calcium + D  Fish Oil 1,000 mg (120 mg-180 mg) capsule  Flax Seed Oil 1,000 mg capsule  Magnesium  Meloxicam 15 mg tablet  Nitrofurantoin 100 mg capsule     GU PSH: None   NON-GU PSH: None   GU PMH: None   NON-GU PMH: None   FAMILY HISTORY: None   SOCIAL HISTORY: Marital Status: Married Preferred Language: English; Ethnicity: Not Hispanic Or Latino; Race: White Current Smoking Status: Patient has never smoked.   Tobacco Use Assessment Completed: Used Tobacco in last 30 days? Has never drank.  Does not drink caffeine.    REVIEW OF SYSTEMS:    GU Review Female:   Patient reports get up at night to urinate. Patient denies frequent urination, hard to postpone urination, burning /pain with urination, leakage of urine, stream starts and stops, trouble starting your stream, have to strain to urinate, and being pregnant.  Gastrointestinal (Upper):   Patient reports nausea and vomiting. Patient denies indigestion/ heartburn.  Gastrointestinal (Lower):   Patient denies diarrhea and constipation.  Constitutional:   Patient reports fever. Patient denies night sweats, weight loss, and fatigue.  Skin:   Patient denies skin rash/ lesion and itching.  Eyes:   Patient denies blurred vision and double vision.  Ears/ Nose/ Throat:   Patient denies sore throat and sinus problems.  Hematologic/Lymphatic:    Patient denies swollen glands and easy bruising.  Cardiovascular:   Patient denies leg swelling and chest pains.  Respiratory:   Patient denies cough and shortness of breath.  Endocrine:   Patient denies excessive thirst.  Musculoskeletal:   Patient reports back pain. Patient denies joint pain.  Neurological:   Patient denies headaches and dizziness.  Psychologic:   Patient denies depression and anxiety.   VITAL SIGNS:      03/22/2019 12:41 PM  Weight 225 lb / 102.06 kg  Height 61 in / 154.94 cm  BP 118/84 mmHg  Pulse 106 /min  Temperature 97.1 F / 36.1 C  BMI 42.5 kg/m   MULTI-SYSTEM PHYSICAL EXAMINATION:    Constitutional: Well-nourished. No physical deformities. Normally developed. Good grooming.  Neck: Neck symmetrical, not swollen. Normal tracheal position.  Respiratory: No labored breathing, no use of accessory muscles.   Cardiovascular: Normal temperature  Skin: No paleness, no jaundice, no cyanosis. No lesion, no ulcer, no rash.  Neurologic / Psychiatric: Oriented to time, oriented to place, oriented to person. No depression, no anxiety, no agitation.  Gastrointestinal: No mass, no tenderness, no rigidity, non obese abdomen.  Eyes: Normal conjunctivae. Normal eyelids.  Ears, Nose, Mouth, and Throat: Left ear no scars, no lesions, no masses. Right ear no scars, no lesions, no masses. Nose no scars, no lesions, no masses. Normal hearing. Normal lips.  Musculoskeletal: Normal gait and station of head and neck.     PAST DATA REVIEWED:  Source  Of History:  Patient  Urine Test Review:   Urinalysis  X-Ray Review: C.T. Abdomen/Pelvis: Reviewed Films. Discussed With Patient.     PROCEDURES:          Urinalysis w/Scope Dipstick Dipstick Cont'd Micro  Color: Amber Bilirubin: Neg mg/dL WBC/hpf: 6 - 10/hpf  Appearance: Slightly Cloudy Ketones: 1+ mg/dL RBC/hpf: 3 - 10/hpf  Specific Gravity: 1.020 Blood: Trace ery/uL Bacteria: Few (10-25/hpf)  pH: 6.0 Protein: Trace mg/dL  Cystals: NS (Not Seen)  Glucose: Neg mg/dL Urobilinogen: 0.2 mg/dL Casts: NS (Not Seen)    Nitrites: Neg Trichomonas: Not Present    Leukocyte Esterase: 1+ leu/uL Mucous: Present      Epithelial Cells: 0 - 5/hpf      Yeast: NS (Not Seen)      Sperm: Not Present    ASSESSMENT:      ICD-10 Details  1 GU:   Renal and ureteral calculus - N20.2 Right, Based on patient's imaging, symptoms and urinalysis I recommend cystoscopy with right ureteral stent placement today. I did have a lengthy discussion with the patient as she is feeling improved this morning however she was given antibiotic in the ER. With a 3 day fever history the safest things to proceed with stent placement. Due to concern for infection this will need to be a staged procedure and she will have a 2nd procedure to remove the actual stone. Risks and benefits of cystoscopy. Platelets were discussed with the patient and she elected to proceed.   PLAN:           Document Letter(s):  Created for Patient: Clinical Summary         Notes:   cc: Adline Mango, MD   To OR today for cysto, stent placement     Signed by Jacalyn Lefevre, M.D. on 03/22/19 at 1:42 PM (EST)

## 2019-03-22 NOTE — H&P (Signed)
CC/HPI: cc: right ureteral calculus   03/22/19: 63 year old woman who presented to the ER last night with right flank pain found to have a 4 mm obstructing proximal ureteral calculus and urinalysis concerning for infection with many bacteria and many WBCs. Patient was febrile to 103 at home but then afebrile in the ER. Patient states that she is feeling a lot better than yesterday and has not had a fever today. Her pain is also improved. She does have a history of stones and had ESWL in 2011-12.     ALLERGIES: penicillin     MEDICATIONS: Keflex 500 mg capsule  Lisinopril 30 mg tablet  Calcium + D  Fish Oil 1,000 mg (120 mg-180 mg) capsule  Flax Seed Oil 1,000 mg capsule  Magnesium  Meloxicam 15 mg tablet  Nitrofurantoin 100 mg capsule     GU PSH: None   NON-GU PSH: None   GU PMH: None   NON-GU PMH: None   FAMILY HISTORY: None   SOCIAL HISTORY: Marital Status: Married Preferred Language: English; Ethnicity: Not Hispanic Or Latino; Race: White Current Smoking Status: Patient has never smoked.   Tobacco Use Assessment Completed: Used Tobacco in last 30 days? Has never drank.  Does not drink caffeine.    REVIEW OF SYSTEMS:    GU Review Female:   Patient reports get up at night to urinate. Patient denies frequent urination, hard to postpone urination, burning /pain with urination, leakage of urine, stream starts and stops, trouble starting your stream, have to strain to urinate, and being pregnant.  Gastrointestinal (Upper):   Patient reports nausea and vomiting. Patient denies indigestion/ heartburn.  Gastrointestinal (Lower):   Patient denies diarrhea and constipation.  Constitutional:   Patient reports fever. Patient denies night sweats, weight loss, and fatigue.  Skin:   Patient denies skin rash/ lesion and itching.  Eyes:   Patient denies blurred vision and double vision.  Ears/ Nose/ Throat:   Patient denies sore throat and sinus problems.  Hematologic/Lymphatic:    Patient denies swollen glands and easy bruising.  Cardiovascular:   Patient denies leg swelling and chest pains.  Respiratory:   Patient denies cough and shortness of breath.  Endocrine:   Patient denies excessive thirst.  Musculoskeletal:   Patient reports back pain. Patient denies joint pain.  Neurological:   Patient denies headaches and dizziness.  Psychologic:   Patient denies depression and anxiety.   VITAL SIGNS:      03/22/2019 12:41 PM  Weight 225 lb / 102.06 kg  Height 61 in / 154.94 cm  BP 118/84 mmHg  Pulse 106 /min  Temperature 97.1 F / 36.1 C  BMI 42.5 kg/m   MULTI-SYSTEM PHYSICAL EXAMINATION:    Constitutional: Well-nourished. No physical deformities. Normally developed. Good grooming.  Neck: Neck symmetrical, not swollen. Normal tracheal position.  Respiratory: No labored breathing, no use of accessory muscles.   Cardiovascular: Normal temperature  Skin: No paleness, no jaundice, no cyanosis. No lesion, no ulcer, no rash.  Neurologic / Psychiatric: Oriented to time, oriented to place, oriented to person. No depression, no anxiety, no agitation.  Gastrointestinal: No mass, no tenderness, no rigidity, non obese abdomen.  Eyes: Normal conjunctivae. Normal eyelids.  Ears, Nose, Mouth, and Throat: Left ear no scars, no lesions, no masses. Right ear no scars, no lesions, no masses. Nose no scars, no lesions, no masses. Normal hearing. Normal lips.  Musculoskeletal: Normal gait and station of head and neck.     PAST DATA REVIEWED:  Source  Of History:  Patient  Urine Test Review:   Urinalysis  X-Ray Review: C.T. Abdomen/Pelvis: Reviewed Films. Discussed With Patient.     PROCEDURES:          Urinalysis w/Scope Dipstick Dipstick Cont'd Micro  Color: Amber Bilirubin: Neg mg/dL WBC/hpf: 6 - 10/hpf  Appearance: Slightly Cloudy Ketones: 1+ mg/dL RBC/hpf: 3 - 10/hpf  Specific Gravity: 1.020 Blood: Trace ery/uL Bacteria: Few (10-25/hpf)  pH: 6.0 Protein: Trace mg/dL  Cystals: NS (Not Seen)  Glucose: Neg mg/dL Urobilinogen: 0.2 mg/dL Casts: NS (Not Seen)    Nitrites: Neg Trichomonas: Not Present    Leukocyte Esterase: 1+ leu/uL Mucous: Present      Epithelial Cells: 0 - 5/hpf      Yeast: NS (Not Seen)      Sperm: Not Present    ASSESSMENT:      ICD-10 Details  1 GU:   Renal and ureteral calculus - N20.2 Right, Based on patient's imaging, symptoms and urinalysis I recommend cystoscopy with right ureteral stent placement today. I did have a lengthy discussion with the patient as she is feeling improved this morning however she was given antibiotic in the ER. With a 3 day fever history the safest things to proceed with stent placement. Due to concern for infection this will need to be a staged procedure and she will have a 2nd procedure to remove the actual stone. Risks and benefits of cystoscopy. Platelets were discussed with the patient and she elected to proceed.   PLAN:           Document Letter(s):  Created for Patient: Clinical Summary         Notes:   cc: Adline Mango, MD   To OR today for cysto, stent placement     Signed by Jacalyn Lefevre, M.D. on 03/22/19 at 1:42 PM (EST)

## 2019-03-22 NOTE — Telephone Encounter (Signed)
Urology treatment plan note:  63 year old female with history of hypertension who has a right ureteral stone.  She first presented to a local urgent care after having a fever several days ago.  Was started on Macrodantin.  Then came to the [facility] long emergency room on 03/21/2019 due to unresolving symptoms.  CT scan showed an obstructing right ureteral stone.  Urology was contacted but at that time she had no CBC and a contaminated urinalysis.  She was then discharged from the emergency room prior to urology being notified of these studies.  We called the patient this morning given clean urine sample that looks significant for infection.  She is overall feeling well but given concern for infection we recommend evaluation with likely right ureteral stent placement.  I spoke with her this morning and she will call the clinic at 8 AM to request an appointment.  She does not have a chart in our system yet  She has a negative Covid test available in care everywhere from 03/20/2019.  She has been n.p.o.

## 2019-03-23 NOTE — Anesthesia Postprocedure Evaluation (Signed)
Anesthesia Post Note  Patient: Susan Carter  Procedure(s) Performed: CYSTOSCOPY WITH RETROGRADE PYELOGRAM/URETERAL STENT PLACEMENT (Right )     Patient location during evaluation: PACU Anesthesia Type: General Level of consciousness: sedated Pain management: pain level controlled Vital Signs Assessment: post-procedure vital signs reviewed and stable Respiratory status: spontaneous breathing and respiratory function stable Cardiovascular status: stable Postop Assessment: no apparent nausea or vomiting Anesthetic complications: no    Last Vitals:  Vitals:   03/22/19 1900 03/22/19 1915  BP: (!) 146/78 135/84  Pulse: 97 98  Resp: (!) 25 (!) 25  Temp:  36.8 C  SpO2: 96% 96%    Last Pain:  Vitals:   03/22/19 1915  TempSrc:   PainSc: 0-No pain                 Vidya Bamford DANIEL

## 2019-03-24 LAB — URINE CULTURE: Culture: 40000 — AB

## 2019-03-25 ENCOUNTER — Telehealth: Payer: Self-pay | Admitting: Emergency Medicine

## 2019-03-25 NOTE — Telephone Encounter (Signed)
Post ED Visit - Positive Culture Follow-up  Culture report reviewed by antimicrobial stewardship pharmacist: [facility] []  , Pharm.D. []  Enzo Bi, Pharm.D., BCPS AQ-ID []  , Pharm.D., BCPS []  Celedonio Miyamoto, Pharm.D., BCPS []  [place], Garvin Fila.D., BCPS, AAHIVP []  , Pharm.D., BCPS, AAHIVP []  Georgina Pillion, PharmD, BCPS []  , PharmD, BCPS []  [place], PharmD, BCPS []  [address], PharmD []  , PharmD, BCPS []  Estella Husk, PharmD  Pharmacy Team [x]  Lysle Pearl, PharmD []  , PharmD []  Phillips Climes, PharmD []  , Rph []  Agapito Games) , PharmD []  Verlan Friends, PharmD []  , PharmD []  Mervyn Gay, PharmD []  , PharmD []  Vinnie Level, PharmD []  [facility], PharmD []  , PharmD []  Len Childs, PharmD   Positive urine culture Treated with nitrofurantoin and cephalexin, organism sensitive to the same and no further patient follow-up is required at this time.  03/25/2019, 11:39 AM

## 2019-03-28 ENCOUNTER — Other Ambulatory Visit: Payer: Self-pay | Admitting: Urology

## 2019-04-03 NOTE — Patient Instructions (Addendum)
DUE TO COVID-19 ONLY ONE VISITOR IS ALLOWED TO COME WITH YOU AND STAY IN THE WAITING ROOM ONLY DURING PRE OP AND PROCEDURE DAY OF SURGERY. THE 1 VISITOR MAY VISIT WITH YOU AFTER SURGERY IN YOUR PRIVATE ROOM DURING VISITING HOURS ONLY!  YOU NEED TO HAVE A COVID 19 TEST ON: 04/05/2018  @ 3:10 pm      , THIS TEST MUST BE DONE BEFORE SURGERY, COME  [place], [address] , [address].  ([place]) ONCE YOUR COVID TEST IS COMPLETED, PLEASE BEGIN THE QUARANTINE INSTRUCTIONS AS OUTLINED IN YOUR HANDOUT.                Tanysha Bruschi     Your procedure is scheduled on: 04/10/2019    Report to [facility] Main  Entrance   Report to admitting at: 7:15  AM     Call this number if you have problems the morning of surgery [phone]     Remember:   Do not eat food or drink liquids :After Midnight.    BRUSH YOUR TEETH MORNING OF SURGERY AND RINSE YOUR MOUTH OUT, NO CHEWING GUM CANDY OR MINTS.     Take these medicines the morning of surgery with A SIP OF WATER: Oxybutynin,Tamsulosin                You may not have any metal on your body including hair pins and              piercings  Do not wear jewelry, make-up, lotions, powders or perfumes, deodorant             Do not wear nail polish on your fingernails.  Do not shave  48 hours prior to surgery.                 Do not bring valuables to the hospital. [place].  Contacts, dentures or bridgework may not be worn into surgery.  Leave suitcase in the car. After surgery it may be brought to your room.     Patients discharged the day of surgery will not be allowed to drive home. IF YOU ARE HAVING SURGERY AND GOING HOME THE SAME DAY, YOU MUST HAVE AN ADULT TO DRIVE YOU HOME AND  BE WITH YOU FOR 24 HOURS. YOU MAY GO HOME BY TAXI OR UBER OR ORTHERWISE, BUT AN ADULT MUST ACCOMPANY YOU HOME AND STAY WITH YOU FOR 24 HOURS.  Name and phone number of your  driver:  Special Instructions: N/A              Please read over the following fact sheets you were given: _____________________________________________________________________             [facility] - Preparing for Surgery Before surgery, you can play an important role.  Because skin is not sterile, your skin needs to be as free of germs as possible.  You can reduce the number of germs on your skin by washing with CHG (chlorahexidine gluconate) soap before surgery.  CHG is an antiseptic cleaner which kills germs and bonds with the skin to continue killing germs even after washing. Please DO NOT use if you have an allergy to CHG or antibacterial soaps.  If your skin becomes reddened/irritated stop using the CHG and inform your nurse when you arrive at Short Stay. Do not shave (including legs  and underarms) for at least 48 hours prior to the first CHG shower.  You may shave your face/neck. Please follow these instructions carefully:  1.  Shower with CHG Soap the night before surgery and the  morning of Surgery.  2.  If you choose to wash your hair, wash your hair first as usual with your  normal  shampoo.  3.  After you shampoo, rinse your hair and body thoroughly to remove the  shampoo.                           4.  Use CHG as you would any other liquid soap.  You can apply chg directly  to the skin and wash                       Gently with a scrungie or clean washcloth.  5.  Apply the CHG Soap to your body ONLY FROM THE NECK DOWN.   Do not use on face/ open                           Wound or open sores. Avoid contact with eyes, ears mouth and genitals (private parts).                       Wash face,  Genitals (private parts) with your normal soap.             6.  Wash thoroughly, paying special attention to the area where your surgery  will be performed.  7.  Thoroughly rinse your body with warm water from the neck down.  8.  DO NOT shower/wash with your normal soap after using and rinsing  off  the CHG Soap.                9.  Pat yourself dry with a clean towel.            10.  Wear clean pajamas.            11.  Place clean sheets on your bed the night of your first shower and do not  sleep with pets. Day of Surgery : Do not apply any lotions/deodorants the morning of surgery.  Please wear clean clothes to the hospital/surgery center.  FAILURE TO FOLLOW THESE INSTRUCTIONS MAY RESULT IN THE CANCELLATION OF YOUR SURGERY PATIENT SIGNATURE_________________________________  NURSE SIGNATURE__________________________________  ________________________________________________________________________

## 2019-04-04 ENCOUNTER — Encounter (HOSPITAL_COMMUNITY)
Admission: RE | Admit: 2019-04-04 | Discharge: 2019-04-04 | Disposition: A | Discharge status: Home / Self Care | Payer: BC Managed Care – PPO | Source: Ambulatory Visit | Attending: Urology | Admitting: Urology

## 2019-04-04 ENCOUNTER — Encounter (HOSPITAL_COMMUNITY): Payer: Self-pay

## 2019-04-04 ENCOUNTER — Other Ambulatory Visit: Payer: Self-pay

## 2019-04-04 DIAGNOSIS — Z01812 Encounter for preprocedural laboratory examination: Secondary | ICD-10-CM | POA: Diagnosis present

## 2019-04-04 DIAGNOSIS — Z01818 Encounter for other preprocedural examination: Secondary | ICD-10-CM | POA: Diagnosis present

## 2019-04-04 DIAGNOSIS — Z01810 Encounter for preprocedural cardiovascular examination: Secondary | ICD-10-CM | POA: Insufficient documentation

## 2019-04-04 LAB — BASIC METABOLIC PANEL
Anion gap: 11 (ref 5–15)
BUN: 19 mg/dL (ref 8–23)
CO2: 25 mmol/L (ref 22–32)
Calcium: 9.7 mg/dL (ref 8.9–10.3)
Chloride: 106 mmol/L (ref 98–111)
Creatinine, Ser: 0.78 mg/dL (ref 0.44–1.00)
GFR calc Af Amer: >60 mL/min (ref >60)
GFR calc non Af Amer: >60 mL/min (ref >60)
Glucose, Bld: 97 mg/dL (ref 70–99)
Potassium: 4 mmol/L (ref 3.5–5.1)
Sodium: 142 mmol/L (ref 135–145)

## 2019-04-04 LAB — CBC
HCT: 42.7 % (ref 36.0–46.0)
Hemoglobin: 13.4 g/dL (ref 12.0–15.0)
MCH: 28.8 pg (ref 26.0–34.0)
MCHC: 31.4 g/dL (ref 30.0–36.0)
MCV: 91.8 fL (ref 80.0–100.0)
Platelets: 314 10*3/uL (ref 150–400)
RBC: 4.65 MIL/uL (ref 3.87–5.11)
RDW: 13.3 % (ref 11.5–15.5)
WBC: 8.1 10*3/uL (ref 4.0–10.5)
nRBC: 0 % (ref 0.0–0.2)

## 2019-04-04 NOTE — Progress Notes (Signed)
PCP - Jennye Boroughs. LOV: 12/08/18 Cardiologist -   Chest x-ray -  EKG -  Stress Test -  ECHO -  Cardiac Cath -   Sleep Study -  CPAP -   Fasting Blood Sugar -  Checks Blood Sugar _____ times a day  Blood Thinner Instructions: Aspirin Instructions: Last Dose:  Anesthesia review:   Patient denies shortness of breath, fever, cough and chest pain at PAT appointment   Patient verbalized understanding of instructions that were given to them at the PAT appointment. Patient was also instructed that they will need to review over the PAT instructions again at home before surgery.

## 2019-04-06 ENCOUNTER — Other Ambulatory Visit: Payer: Self-pay | Admitting: Urology

## 2019-04-06 ENCOUNTER — Other Ambulatory Visit (HOSPITAL_COMMUNITY)
Admission: RE | Admit: 2019-04-06 | Discharge: 2019-04-06 | Disposition: A | Discharge status: Home / Self Care | Payer: BC Managed Care – PPO | Source: Ambulatory Visit | Attending: Urology | Admitting: Urology

## 2019-04-06 DIAGNOSIS — Z20822 Contact with and (suspected) exposure to covid-19: Secondary | ICD-10-CM | POA: Diagnosis not present

## 2019-04-06 DIAGNOSIS — Z01812 Encounter for preprocedural laboratory examination: Secondary | ICD-10-CM | POA: Insufficient documentation

## 2019-04-07 LAB — NOVEL CORONAVIRUS, NAA (HOSP ORDER, SEND-OUT TO REF LAB; TAT 18-24 HRS): SARS-CoV-2, NAA: NOT DETECTED

## 2019-04-10 ENCOUNTER — Ambulatory Visit (HOSPITAL_COMMUNITY): Payer: BC Managed Care – PPO | Admitting: Physician Assistant

## 2019-04-10 ENCOUNTER — Ambulatory Visit (HOSPITAL_COMMUNITY)
Admission: RE | Admit: 2019-04-10 | Discharge: 2019-04-10 | Disposition: A | Discharge status: Home / Self Care | Payer: BC Managed Care – PPO | Attending: Urology | Admitting: Urology

## 2019-04-10 ENCOUNTER — Encounter (HOSPITAL_COMMUNITY)
Admission: RE | Disposition: A | Discharge status: Home / Self Care | Payer: Self-pay | Source: Home / Self Care | Attending: Urology

## 2019-04-10 ENCOUNTER — Ambulatory Visit (HOSPITAL_COMMUNITY): Payer: BC Managed Care – PPO

## 2019-04-10 ENCOUNTER — Encounter (HOSPITAL_COMMUNITY): Payer: Self-pay | Admitting: Urology

## 2019-04-10 DIAGNOSIS — Z791 Long term (current) use of non-steroidal anti-inflammatories (NSAID): Secondary | ICD-10-CM | POA: Diagnosis not present

## 2019-04-10 DIAGNOSIS — N201 Calculus of ureter: Secondary | ICD-10-CM | POA: Insufficient documentation

## 2019-04-10 DIAGNOSIS — I10 Essential (primary) hypertension: Secondary | ICD-10-CM | POA: Insufficient documentation

## 2019-04-10 DIAGNOSIS — Z79899 Other long term (current) drug therapy: Secondary | ICD-10-CM | POA: Diagnosis not present

## 2019-04-10 DIAGNOSIS — Z6841 Body Mass Index (BMI) 40.0 and over, adult: Secondary | ICD-10-CM | POA: Diagnosis not present

## 2019-04-10 DIAGNOSIS — M1990 Unspecified osteoarthritis, unspecified site: Secondary | ICD-10-CM | POA: Insufficient documentation

## 2019-04-10 HISTORY — PX: CYSTOSCOPY/URETEROSCOPY/HOLMIUM LASER/STENT PLACEMENT: SHX6546

## 2019-04-10 SURGERY — CYSTOSCOPY/URETEROSCOPY/HOLMIUM LASER/STENT PLACEMENT
Anesthesia: General | Laterality: Right

## 2019-04-10 MED ORDER — CIPROFLOXACIN HCL 500 MG PO TABS: 500.0000 mg | ORAL_TABLET | Freq: Every day | ORAL | 0 refills | Status: AC

## 2019-04-10 MED ORDER — OXYCODONE HCL 5 MG PO TABS: 5.0000 mg | ORAL_TABLET | Freq: Once | ORAL | Status: DC | PRN

## 2019-04-10 MED ORDER — CIPROFLOXACIN IN D5W 400 MG/200ML IV SOLN
400.0000 mg | Freq: Two times a day (BID) | INTRAVENOUS | Status: DC
  Administered 2019-04-10: 400 mg via INTRAVENOUS
  Filled 2019-04-10: qty 200

## 2019-04-10 MED ORDER — SODIUM CHLORIDE 0.9 % IR SOLN
Status: DC | PRN
  Administered 2019-04-10: 3000 mL

## 2019-04-10 MED ORDER — ONDANSETRON HCL 4 MG/2ML IJ SOLN
INTRAMUSCULAR | Status: AC
  Filled 2019-04-10: qty 2

## 2019-04-10 MED ORDER — DEXAMETHASONE SODIUM PHOSPHATE 10 MG/ML IJ SOLN
INTRAMUSCULAR | Status: DC | PRN
  Administered 2019-04-10: 8 mg via INTRAVENOUS

## 2019-04-10 MED ORDER — FENTANYL CITRATE (PF) 100 MCG/2ML IJ SOLN
INTRAMUSCULAR | Status: AC
  Filled 2019-04-10: qty 2

## 2019-04-10 MED ORDER — SCOPOLAMINE 1 MG/3DAYS TD PT72
MEDICATED_PATCH | TRANSDERMAL | Status: AC
  Filled 2019-04-10: qty 1

## 2019-04-10 MED ORDER — MIDAZOLAM HCL 2 MG/2ML IJ SOLN
INTRAMUSCULAR | Status: AC
  Filled 2019-04-10: qty 2

## 2019-04-10 MED ORDER — 0.9 % SODIUM CHLORIDE (POUR BTL) OPTIME
TOPICAL | Status: DC | PRN
  Administered 2019-04-10: 10:00:00 1000 mL

## 2019-04-10 MED ORDER — LIDOCAINE 2% (20 MG/ML) 5 ML SYRINGE
INTRAMUSCULAR | Status: DC | PRN
  Administered 2019-04-10: 80 mg via INTRAVENOUS

## 2019-04-10 MED ORDER — HYDROMORPHONE HCL 1 MG/ML IJ SOLN: 0.2500 mg | INTRAMUSCULAR | Status: DC | PRN

## 2019-04-10 MED ORDER — PROPOFOL 10 MG/ML IV BOLUS
INTRAVENOUS | Status: AC
  Filled 2019-04-10: qty 20

## 2019-04-10 MED ORDER — SCOPOLAMINE 1 MG/3DAYS TD PT72
MEDICATED_PATCH | TRANSDERMAL | Status: DC | PRN
  Administered 2019-04-10: 1 via TRANSDERMAL

## 2019-04-10 MED ORDER — LACTATED RINGERS IV SOLN: INTRAVENOUS | Status: DC

## 2019-04-10 MED ORDER — MEPERIDINE HCL 50 MG/ML IJ SOLN: 6.2500 mg | INTRAMUSCULAR | Status: DC | PRN

## 2019-04-10 MED ORDER — DEXAMETHASONE SODIUM PHOSPHATE 10 MG/ML IJ SOLN
INTRAMUSCULAR | Status: AC
  Filled 2019-04-10: qty 1

## 2019-04-10 MED ORDER — FENTANYL CITRATE (PF) 100 MCG/2ML IJ SOLN
INTRAMUSCULAR | Status: DC | PRN
  Administered 2019-04-10: 50 ug via INTRAVENOUS
  Administered 2019-04-10 (×2): 25 ug via INTRAVENOUS

## 2019-04-10 MED ORDER — PROMETHAZINE HCL 25 MG/ML IJ SOLN: 6.2500 mg | INTRAMUSCULAR | Status: DC | PRN

## 2019-04-10 MED ORDER — PROPOFOL 10 MG/ML IV BOLUS
INTRAVENOUS | Status: DC | PRN
  Administered 2019-04-10: 200 mg via INTRAVENOUS

## 2019-04-10 MED ORDER — MIDAZOLAM HCL 5 MG/5ML IJ SOLN
INTRAMUSCULAR | Status: DC | PRN
  Administered 2019-04-10: 2 mg via INTRAVENOUS

## 2019-04-10 MED ORDER — LIDOCAINE 2% (20 MG/ML) 5 ML SYRINGE
INTRAMUSCULAR | Status: AC
  Filled 2019-04-10: qty 5

## 2019-04-10 MED ORDER — OXYCODONE HCL 5 MG/5ML PO SOLN: 5.0000 mg | Freq: Once | ORAL | Status: DC | PRN

## 2019-04-10 MED ORDER — ONDANSETRON HCL 4 MG/2ML IJ SOLN
INTRAMUSCULAR | Status: DC | PRN
  Administered 2019-04-10: 4 mg via INTRAVENOUS

## 2019-04-10 SURGICAL SUPPLY — 23 items
BAG URO CATCHER STRL LF (MISCELLANEOUS) ×3 IMPLANT
BASKET ZERO TIP NITINOL 2.4FR (BASKET) ×2 IMPLANT
CATH URET 5FR 28IN OPEN ENDED (CATHETERS) ×3 IMPLANT
CLOTH BEACON ORANGE TIMEOUT ST (SAFETY) ×3 IMPLANT
DRSG TEGADERM 2-3/8X2-3/4 SM (GAUZE/BANDAGES/DRESSINGS) ×3 IMPLANT
EXTRACTOR STONE 1.7FRX115CM (UROLOGICAL SUPPLIES) IMPLANT
GLOVE BIO SURGEON STRL SZ 6.5 (GLOVE) ×2 IMPLANT
GLOVE BIO SURGEONS STRL SZ 6.5 (GLOVE) ×1
GOWN STRL REUS W/TWL LRG LVL3 (GOWN DISPOSABLE) ×3 IMPLANT
GUIDEWIRE ANG ZIPWIRE 038X150 (WIRE) IMPLANT
GUIDEWIRE STR DUAL SENSOR (WIRE) ×3 IMPLANT
GUIDEWIRE ZIPWRE .038 STRAIGHT (WIRE) ×2 IMPLANT
KIT TURNOVER KIT A (KITS) IMPLANT
MANIFOLD NEPTUNE II (INSTRUMENTS) ×3 IMPLANT
PACK CYSTO (CUSTOM PROCEDURE TRAY) ×3 IMPLANT
PENCIL SMOKE EVACUATOR (MISCELLANEOUS) IMPLANT
SHEATH URETERAL 12FRX28CM (UROLOGICAL SUPPLIES) ×2 IMPLANT
SHEATH URETERAL 12FRX35CM (MISCELLANEOUS) IMPLANT
STENT CONTOUR 6FRX24X.038 (STENTS) ×2 IMPLANT
TUBING CONNECTING 10 (TUBING) ×2 IMPLANT
TUBING CONNECTING 10' (TUBING) ×1
TUBING UROLOGY SET (TUBING) ×3 IMPLANT
WIRE COONS/BENSON .038X145CM (WIRE) IMPLANT

## 2019-04-10 NOTE — Transfer of Care (Signed)
Immediate Anesthesia Transfer of Care Note  Patient: Susan Carter  Procedure(s) Performed: CYSTOSCOPY, URETEROSCOPY, STENT EXCHANGE (Right )  Patient Location: PACU  Anesthesia Type:General  Level of Consciousness: awake, alert , oriented and patient cooperative  Airway & Oxygen Therapy: Patient Spontanous Breathing and Patient connected to face mask oxygen  Post-op Assessment: Report given to RN, Post -op Vital signs reviewed and stable and Patient moving all extremities  Post vital signs: Reviewed and stable  Last Vitals:  Vitals Value Taken Time  BP    Temp    Pulse 79 04/10/19 0945  Resp 19 04/10/19 0945  SpO2 100 % 04/10/19 0945  Vitals shown include unvalidated device data.  Last Pain:  Vitals:   04/10/19 0744  TempSrc:   PainSc: 0-No pain         Complications: No apparent anesthesia complications

## 2019-04-10 NOTE — Anesthesia Procedure Notes (Signed)
Date/Time: 04/10/2019 9:08 AM Performed by: Elisabeth Cara, CRNA Pre-anesthesia Checklist: Patient identified, Emergency Drugs available, Suction available, Patient being monitored and Timeout performed Patient Re-evaluated:Patient Re-evaluated prior to induction Oxygen Delivery Method: Circle system utilized Preoxygenation: Pre-oxygenation with 100% oxygen Induction Type: IV induction Ventilation: Mask ventilation without difficulty Number of attempts: 1 Placement Confirmation: positive ETCO2 and breath sounds checked- equal and bilateral Tube secured with: Tape Dental Injury: Teeth and Oropharynx as per pre-operative assessment

## 2019-04-10 NOTE — Anesthesia Postprocedure Evaluation (Signed)
Anesthesia Post Note  Patient: Susan Carter  Procedure(s) Performed: CYSTOSCOPY, URETEROSCOPY, STENT EXCHANGE (Right )     Patient location during evaluation: PACU Anesthesia Type: General Level of consciousness: sedated and patient cooperative Pain management: pain level controlled Vital Signs Assessment: post-procedure vital signs reviewed and stable Respiratory status: spontaneous breathing Cardiovascular status: stable Anesthetic complications: no    Last Vitals:  Vitals:   04/10/19 1008 04/10/19 1049  BP: 129/76 136/88  Pulse: 65 80  Resp: 16 18  Temp: 36.5 C 36.7 C  SpO2: 97% 98%    Last Pain:  Vitals:   04/10/19 1049  TempSrc: Oral  PainSc: 0-No pain                 Lewie Loron

## 2019-04-10 NOTE — Interval H&P Note (Signed)
History and Physical Interval Note: Patient is status post a right ureteral stent placement secondary to obstructing stone with infection on 03/21/2020.  No changes or complaints today.  She is here for definitive removal of kidney stone.  04/10/2019 8:55 AM  Susan Carter  has presented today for surgery, with the diagnosis of RIGHT URETERAL CALCULUS.  The various methods of treatment have been discussed with the patient and family. After consideration of risks, benefits and other options for treatment, the patient has consented to  Procedure(s) with comments: CYSTOSCOPY/URETEROSCOPY/HOLMIUM LASER/STENT PLACEMENT (Right) - 1 HR as a surgical intervention.  The patient's history has been reviewed, patient examined, no change in status, stable for surgery.  I have reviewed the patient's chart and labs.  Questions were answered to the patient's satisfaction.     Javar Eshbach D Elmo Rio

## 2019-04-10 NOTE — Op Note (Signed)
PATIENT:  Susan Carter  PRE-OPERATIVE DIAGNOSIS:  right Ureteral calculus  POST-OPERATIVE DIAGNOSIS: Same  PROCEDURE:  1. Cystoscopy, right flexible ureteroscopy with basket stone extraction, right ureteral stent exchange  SURGEON: Kasandra Knudsen, MD  INDICATION: 64 year old woman with right flank pain found to have a 4 mm proximal obstructing ureteral calculus in the setting of a UTI and fever status post stent placement on 03/22/2019 returns for definitive stone removal.  ANESTHESIA:  General  EBL:  Minimal  DRAINS: 6Fr x 24cm right ureteral stent with string  SPECIMEN:  none  DESCRIPTION OF PROCEDURE: The patient was taken to the major OR and placed on the table. General anesthesia was administered and then the patient was moved to the dorsal lithotomy position. The genitalia was sterilely prepped and draped. An official timeout was performed.  Initially the 23 French cystoscope with 30 lens was passed under direct vision into the bladder. The bladder was then fully inspected. It was noted be free of any tumors, stones or inflammatory lesions. The existing left ureteral stent was grasped with a grasper and brought to the urethral meatus.  A 0.38 sensor wire was then advanced through the ureteral stent up to the kidney with fluoroscopic guidance.  The ureteral stent was then discarded.  Next a second wire was placed alongside the sensor wire and advanced to the kidney.  A ureteral access sheath was then placed over the second wire with fluoroscopic guidance.  The inner sheath and wire were removed.  A 6 French possible ureteroscope was then passed under direct into that sheath and up the ureter. A 4 mm proximal stone was identified and basket extracted. Continued inspection of the renal pelvis showed a 3 mm midpole stone. This was also basket stone extracted. No other renal calculi were seen. Reinspection of the ureter ureteroscopically revealed no further stone fragments and no  injury to the ureter. I then backloaded the cystoscope over the guidewire and passed the stent over the guidewire into the area of the renal pelvis. As the guidewire was removed good curl was noted in the renal pelvis. The bladder was drained and the cystoscope was then removed. The patient tolerated the procedure well no intraoperative complications.  PLAN OF CARE: Discharge to home after PACU  PATIENT DISPOSITION:  PACU - hemodynamically stable.  FOLLOW UP: stent with string left and postop instructions to remove stent in 3 days

## 2019-04-10 NOTE — Anesthesia Preprocedure Evaluation (Addendum)
Anesthesia Evaluation  Patient identified by MRN, date of birth, ID band Patient awake    Reviewed: Allergy & Precautions, NPO status , Patient's Chart, lab work & pertinent test results  History of Anesthesia Complications (+) PONV and history of anesthetic complications  Airway Mallampati: II  TM Distance: >3 FB Neck ROM: Full    Dental no notable dental hx. (+) Dental Advisory Given   Pulmonary neg pulmonary ROS,    Pulmonary exam normal        Cardiovascular hypertension, Normal cardiovascular exam+ Valvular Problems/Murmurs  Rhythm:Regular     Neuro/Psych negative neurological ROS     GI/Hepatic negative GI ROS, Neg liver ROS,   Endo/Other  Morbid obesity  Renal/GU negative Renal ROS     Musculoskeletal  (+) Arthritis ,   Abdominal   Peds  Hematology negative hematology ROS (+)   Anesthesia Other Findings Day of surgery medications reviewed with the patient.  Reproductive/Obstetrics                            Anesthesia Physical  Anesthesia Plan  ASA: III  Anesthesia Plan: General   Post-op Pain Management:    Induction: Intravenous  PONV Risk Score and Plan: 4 or greater and Ondansetron, Dexamethasone, Scopolamine patch - Pre-op and Midazolam  Airway Management Planned: LMA  Additional Equipment: None  Intra-op Plan:   Post-operative Plan: Extubation in OR  Informed Consent: I have reviewed the patients History and Physical, chart, labs and discussed the procedure including the risks, benefits and alternatives for the proposed anesthesia with the patient or authorized representative who has indicated his/her understanding and acceptance.     Dental advisory given  Plan Discussed with: CRNA  Anesthesia Plan Comments:       Anesthesia Quick Evaluation

## 2019-04-10 NOTE — Discharge Instructions (Signed)
Post stent placement instructions   Definitions:  Ureter: The duct that transports urine from the kidney to the bladder. Stent: A plastic hollow tube that is placed into the ureter, from the kidney to the bladder to prevent the ureter from swelling shut.  General instructions:  Despite the fact that no skin incisions were used, the area around the ureter and bladder is raw and irritated. The stent is a foreign body which can further irritate the bladder wall. This irritation is manifested by increased frequency of urination, both day and night, and by an increase in the urge to urinate. In some, the urge to urinate is present almost always. Sometimes the urge is strong enough that you may not be able to stop your self from urinating. This can often be controlled with medication but does not occur in everyone. A stent can safely be left in place for 3 months or greater.  You may see some blood in your urine while the stent is in place and a few days afterward. Do not be alarmed, even if the urine is clear for a while. Get off your feet and drink lots of fluids until clearing occurs. If you start to pass clots or don't improve, call us.  Diet:  You may return to your normal diet immediately. Because of the raw surface of your bladder, alcohol, spicy foods, foods high in acid and drinks with caffeine may cause irritation or frequency and should be used in moderation. To keep your urine flowing freely and avoid constipation, drink plenty of fluids during the day (8-10 glasses). Tip: Avoid cranberry juice because it is very acidic.  Activity:  Your physical activity doesn't need to be restricted. However, if you are very active, you may see some blood in the urine. We suggest that you reduce your activity under the circumstances until the bleeding has stopped.  Bowels:  It is important to keep your bowels regular during the postoperative period. Straining with bowel movements can cause bleeding. A  bowel movement every other day is reasonable. Use a mild laxative if needed, such as milk of magnesia 2-3 tablespoons, or 2 Dulcolax tablets. Call if you continue to have problems. If you had been taking narcotics for pain, before, during or after your surgery, you may be constipated. Take a laxative if necessary.  Medication:  You should resume your pre-surgery medications unless told not to. In addition you may be given an antibiotic to prevent or treat infection. Antibiotics are not always necessary. All medication should be taken as prescribed until the bottles are finished unless you are having an unusual reaction to one of the drugs.  Problems you should report to [place]:  a. Fever greater than 101F. b. Heavy bleeding, or clots (see notes above about blood in urine). c. Inability to urinate. d. Drug reactions (hives, rash, nausea, vomiting, diarrhea). e. Severe burning or pain with urination that is not improving.  Stent removal Please take 1 tab of Cipro the morning of stent removal.  You can remove the stent on Friday, 04/13/19. Remove the bandage taped to your leg and pull the string until the stent is removed.  If you experience pain after the stent is removed, please take motrin/ibuprofen and flomax to help with discomfort.  If you have difficulty removing the stent you can call the office.

## 2020-05-20 IMAGING — CT CT RENAL STONE PROTOCOL
2 of 4 series · 16 of 46 positions shown, 18 images · non-contrast
Comparison: None.

CLINICAL DATA: Right flank pain. Diagnosed with urinary tract
infection yesterday. No relief with antibiotics.

EXAM:
CT ABDOMEN AND PELVIS WITHOUT CONTRAST
TECHNIQUE: Multidetector CT imaging of the abdomen and pelvis was performed
following the standard protocol without IV contrast.

[Series 2: axial st · axial · 0.75mm/px · z∈[+970,+1365]mm · 13 of 89 slices shown, 15 images]
[im 5/89  soft-tissue]
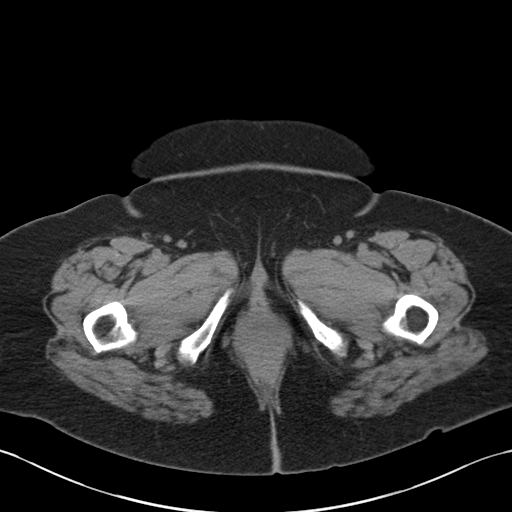
[im 5/89  bone]
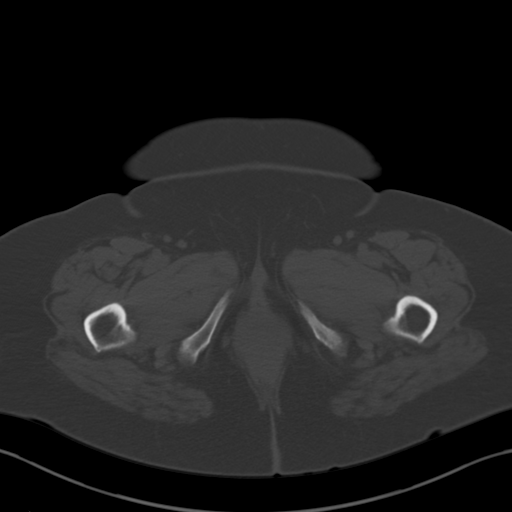
[im 10/89  soft-tissue]
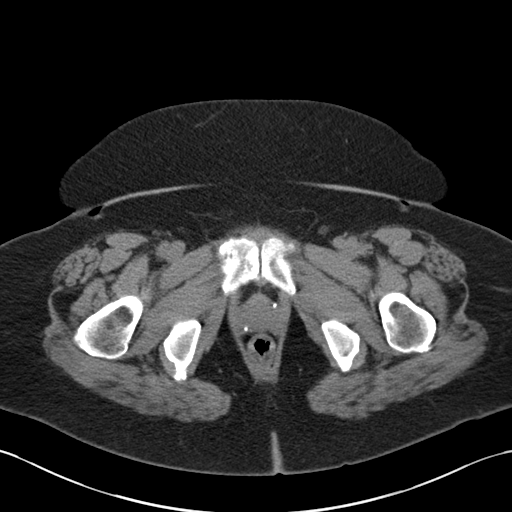
[im 20/89  soft-tissue]
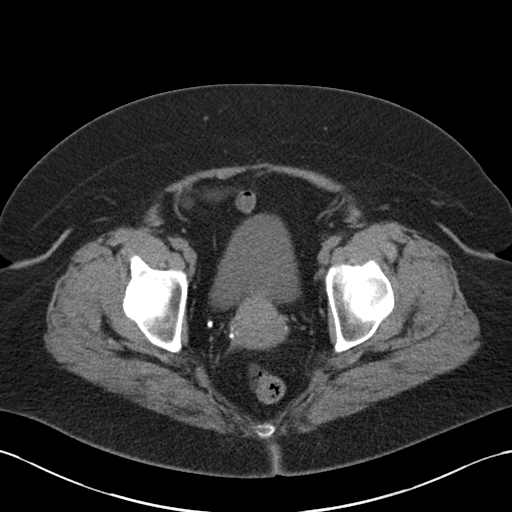
[im 25/89  soft-tissue]
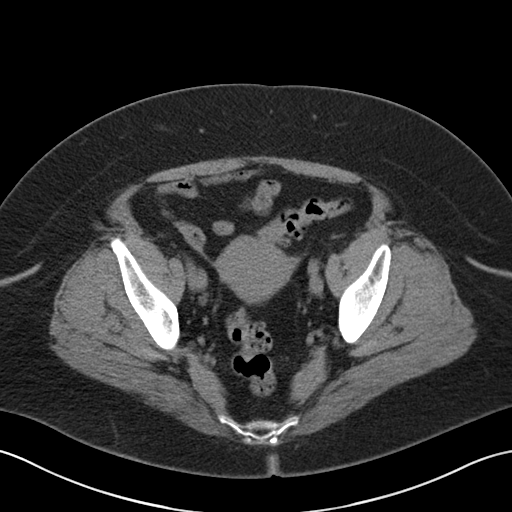
[im 30/89  soft-tissue]
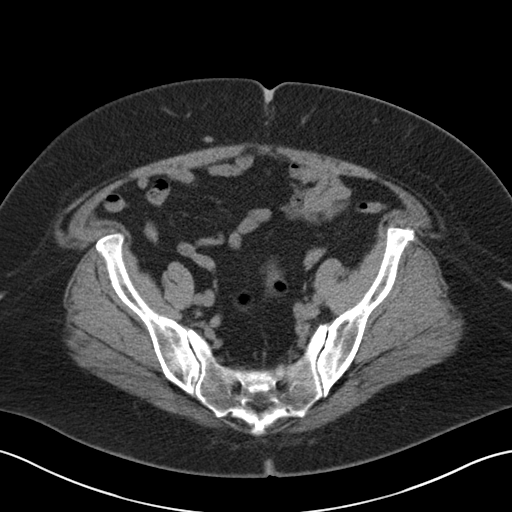
[im 40/89  soft-tissue]
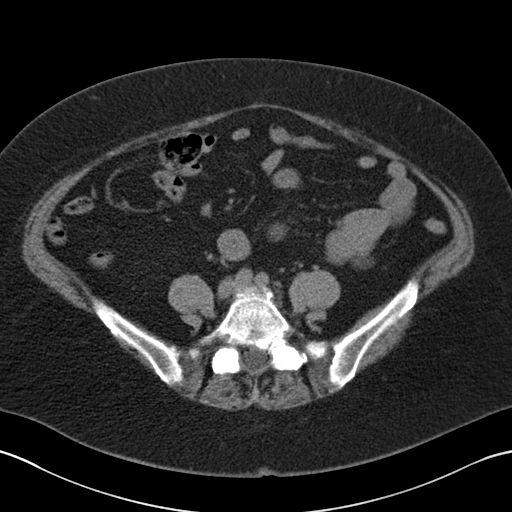
[im 45/89  soft-tissue]
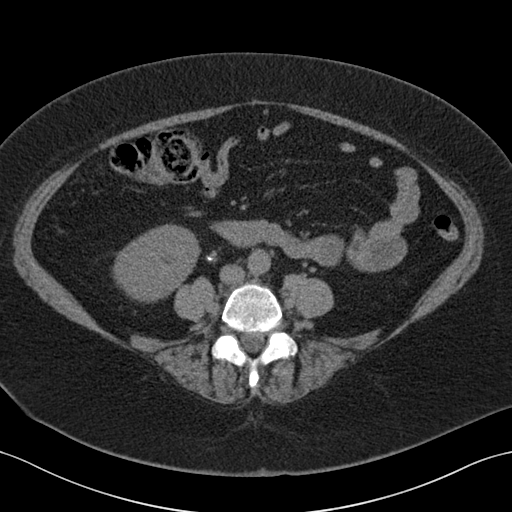
[im 49/89  soft-tissue]
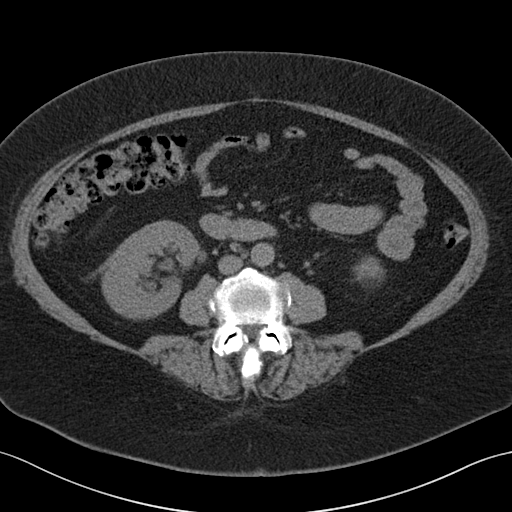
[im 59/89  soft-tissue]
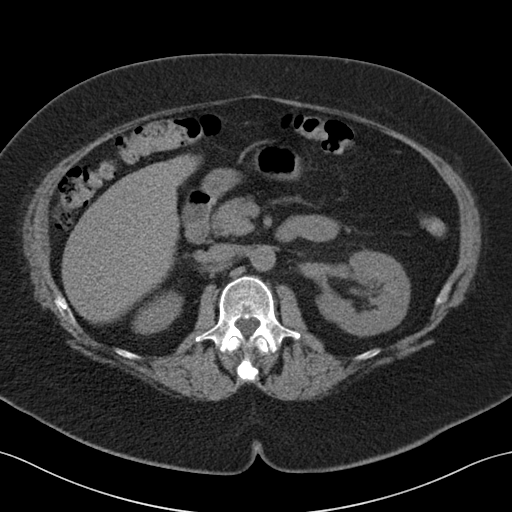
[im 59/89  bone]
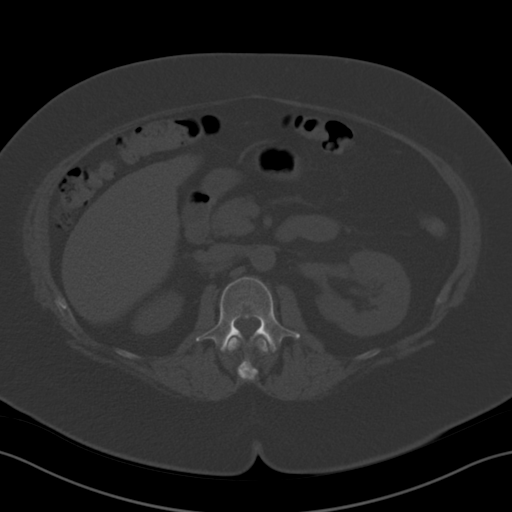
[im 64/89  soft-tissue]
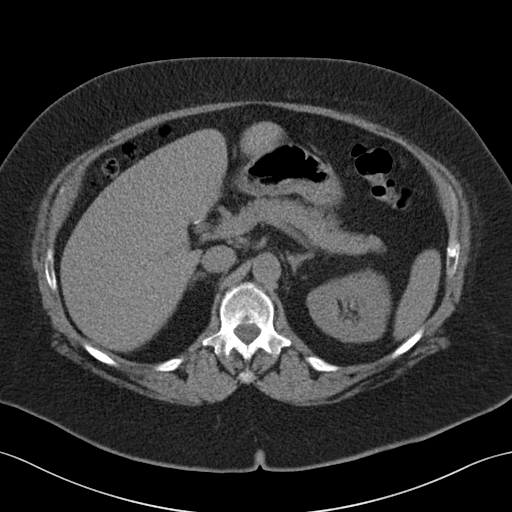
[im 69/89  soft-tissue]
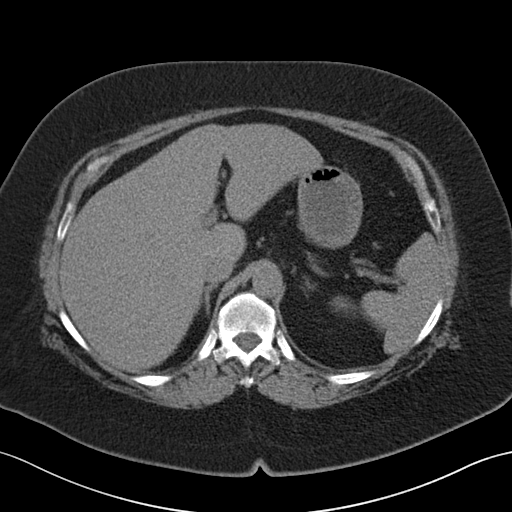
[im 79/89  soft-tissue]
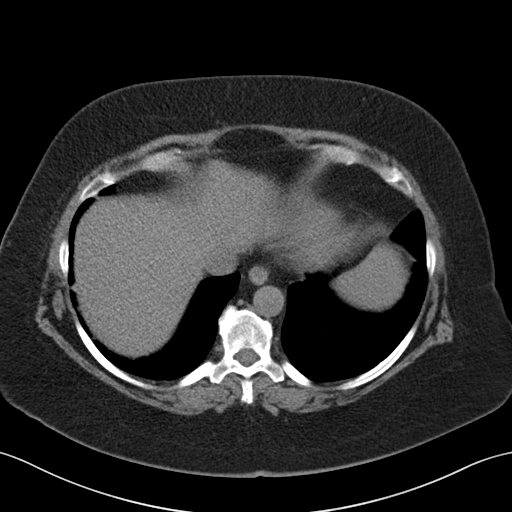
[im 84/89  soft-tissue]
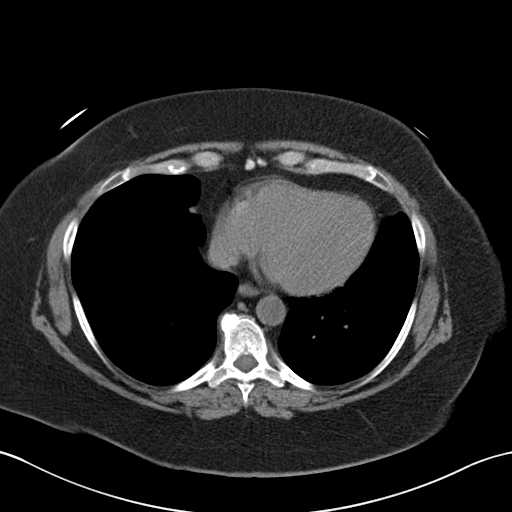

[Series 5: coronal · coronal · 0.71mm/px · 3 of 151 slices shown]
[im 51/151  soft-tissue]
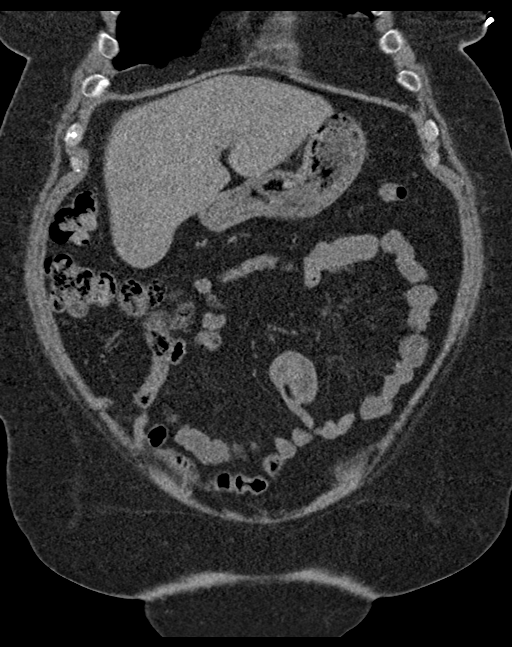
[im 67/151  soft-tissue]
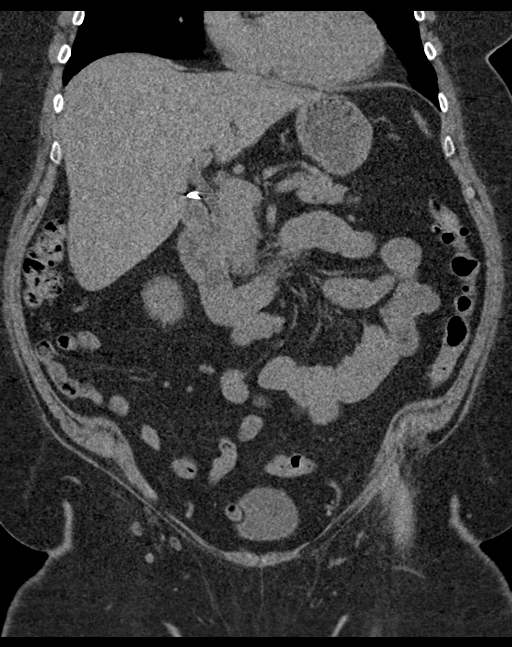
[im 84/151  soft-tissue]
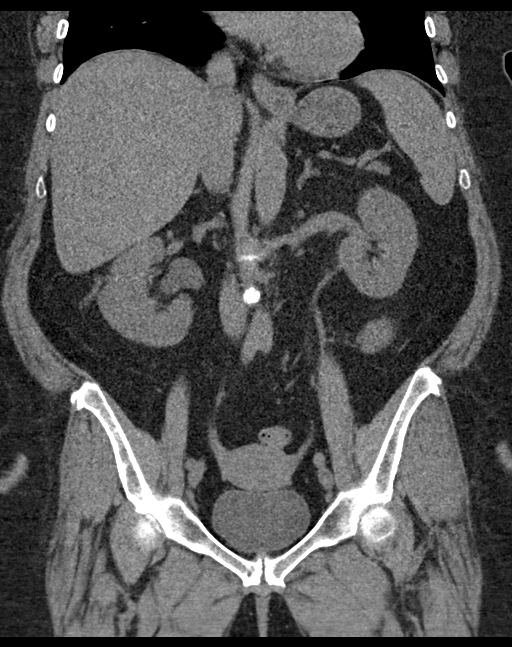

[16 of 46 positions shown; findings below may reference images not displayed]

FINDINGS: Lower chest: Lung bases are clear.

Hepatobiliary: No focal liver abnormality is seen. Status post
cholecystectomy. No biliary dilatation.

Pancreas: No ductal dilatation or inflammation.

Spleen: Normal in size without focal abnormality. Small splenule
anteriorly.

Adrenals/Urinary Tract: Normal adrenal glands. Obstructing 4 x 4 mm
stone in the right proximal ureter just distal to the ureteropelvic
junction with mild right hydronephrosis. The more distal ureter is
decompressed. There are phleboliths in the adjacent right ovarian
vein. Additional nonobstructing 3 mm stone in the lower right
kidney. No left hydronephrosis or left renal calculi. Cyst in the
lower left kidney measures approximately 2.8 cm, not completely
characterized in the absence of IV contrast. Left ureter is
decompressed. Urinary bladder is partially distended. No bladder
stone or wall thickening.

Stomach/Bowel: Stomach is unremarkable. No bowel wall thickening,
obstruction, or inflammation. Appendix is normal, courses anterior
to the cecum, series 2, image 55. Mild left colonic diverticulosis
without diverticulitis.

Vascular/Lymphatic: Abdominal aorta is normal in caliber.
Retroaortic left renal vein. No abdominopelvic adenopathy.

Reproductive: Uterus and bilateral adnexa are unremarkable.

Other: Tiny fat containing umbilical hernia. No free air, free
fluid, or intra-abdominal fluid collection.

Musculoskeletal: Multilevel degenerative change in the lumbar spine.
There are no acute or suspicious osseous abnormalities.
IMPRESSION: 1. Obstructing 4 x 4 mm stone in the right proximal ureter with mild
hydronephrosis.
2. Additional nonobstructing 3 mm stone in the lower right kidney.
3. Left colonic diverticulosis without diverticulitis.
